# Patient Record
Sex: Female | Born: 1945
Health system: Southern US, Community
[De-identification: ages and names within clinical notes are randomized; demographics above are authoritative.]

## PROBLEM LIST (undated history)

## (undated) DIAGNOSIS — K760 Fatty (change of) liver, not elsewhere classified: Secondary | ICD-10-CM

## (undated) DIAGNOSIS — M8580 Other specified disorders of bone density and structure, unspecified site: Secondary | ICD-10-CM

## (undated) DIAGNOSIS — G4700 Insomnia, unspecified: Secondary | ICD-10-CM

## (undated) DIAGNOSIS — R7303 Prediabetes: Secondary | ICD-10-CM

## (undated) DIAGNOSIS — M1990 Unspecified osteoarthritis, unspecified site: Secondary | ICD-10-CM

## (undated) DIAGNOSIS — E785 Hyperlipidemia, unspecified: Secondary | ICD-10-CM

## (undated) DIAGNOSIS — I10 Essential (primary) hypertension: Secondary | ICD-10-CM

## (undated) DIAGNOSIS — E039 Hypothyroidism, unspecified: Secondary | ICD-10-CM

## (undated) DIAGNOSIS — K219 Gastro-esophageal reflux disease without esophagitis: Secondary | ICD-10-CM

---

## 1998-04-11 ENCOUNTER — Other Ambulatory Visit: Admission: RE | Admit: 1998-04-11 | Discharge: 1998-04-11 | Payer: Self-pay | Admitting: Obstetrics and Gynecology

## 1999-04-16 ENCOUNTER — Other Ambulatory Visit: Admission: RE | Admit: 1999-04-16 | Discharge: 1999-04-16 | Payer: Self-pay | Admitting: Obstetrics and Gynecology

## 1999-04-24 ENCOUNTER — Encounter: Admission: RE | Admit: 1999-04-24 | Discharge: 1999-04-24 | Payer: Self-pay | Admitting: Obstetrics and Gynecology

## 1999-04-24 ENCOUNTER — Encounter: Payer: Self-pay | Admitting: Obstetrics and Gynecology

## 1999-05-15 ENCOUNTER — Encounter (INDEPENDENT_AMBULATORY_CARE_PROVIDER_SITE_OTHER): Payer: Self-pay

## 1999-05-15 ENCOUNTER — Other Ambulatory Visit: Admission: RE | Admit: 1999-05-15 | Discharge: 1999-05-15 | Payer: Self-pay | Admitting: Obstetrics and Gynecology

## 2000-04-24 ENCOUNTER — Encounter: Payer: Self-pay | Admitting: Obstetrics and Gynecology

## 2000-04-24 ENCOUNTER — Encounter: Admission: RE | Admit: 2000-04-24 | Discharge: 2000-04-24 | Payer: Self-pay | Admitting: Obstetrics and Gynecology

## 2000-04-27 ENCOUNTER — Other Ambulatory Visit: Admission: RE | Admit: 2000-04-27 | Discharge: 2000-04-27 | Payer: Self-pay | Admitting: Obstetrics and Gynecology

## 2000-05-22 ENCOUNTER — Other Ambulatory Visit: Admission: RE | Admit: 2000-05-22 | Discharge: 2000-05-22 | Payer: Self-pay | Admitting: Obstetrics and Gynecology

## 2000-05-22 ENCOUNTER — Encounter (INDEPENDENT_AMBULATORY_CARE_PROVIDER_SITE_OTHER): Payer: Self-pay

## 2001-04-26 ENCOUNTER — Encounter: Admission: RE | Admit: 2001-04-26 | Discharge: 2001-04-26 | Payer: Self-pay | Admitting: Obstetrics and Gynecology

## 2001-04-26 ENCOUNTER — Encounter: Payer: Self-pay | Admitting: Obstetrics and Gynecology

## 2001-04-29 ENCOUNTER — Other Ambulatory Visit: Admission: RE | Admit: 2001-04-29 | Discharge: 2001-04-29 | Payer: Self-pay | Admitting: Obstetrics and Gynecology

## 2002-04-27 ENCOUNTER — Encounter: Admission: RE | Admit: 2002-04-27 | Discharge: 2002-04-27 | Payer: Self-pay | Admitting: Obstetrics and Gynecology

## 2002-04-27 ENCOUNTER — Encounter: Payer: Self-pay | Admitting: Obstetrics and Gynecology

## 2003-06-23 ENCOUNTER — Encounter: Admission: RE | Admit: 2003-06-23 | Discharge: 2003-06-23 | Payer: Self-pay | Admitting: Obstetrics and Gynecology

## 2003-08-08 ENCOUNTER — Encounter: Admission: RE | Admit: 2003-08-08 | Discharge: 2003-08-08 | Payer: Self-pay | Admitting: Family Medicine

## 2004-06-24 ENCOUNTER — Encounter: Admission: RE | Admit: 2004-06-24 | Discharge: 2004-06-24 | Payer: Self-pay | Admitting: Obstetrics and Gynecology

## 2005-06-23 ENCOUNTER — Other Ambulatory Visit: Admission: RE | Admit: 2005-06-23 | Discharge: 2005-06-23 | Payer: Self-pay | Admitting: Obstetrics and Gynecology

## 2005-06-24 ENCOUNTER — Encounter: Admission: RE | Admit: 2005-06-24 | Discharge: 2005-06-24 | Payer: Self-pay | Admitting: Obstetrics and Gynecology

## 2006-06-25 ENCOUNTER — Encounter: Admission: RE | Admit: 2006-06-25 | Discharge: 2006-06-25 | Payer: Self-pay | Admitting: Obstetrics and Gynecology

## 2007-07-07 ENCOUNTER — Encounter: Admission: RE | Admit: 2007-07-07 | Discharge: 2007-07-07 | Payer: Self-pay | Admitting: Family Medicine

## 2008-03-14 ENCOUNTER — Encounter: Admission: RE | Admit: 2008-03-14 | Discharge: 2008-03-14 | Payer: Self-pay | Admitting: Family Medicine

## 2008-03-17 ENCOUNTER — Encounter: Admission: RE | Admit: 2008-03-17 | Discharge: 2008-03-17 | Payer: Self-pay | Admitting: Family Medicine

## 2008-05-05 ENCOUNTER — Encounter: Admission: RE | Admit: 2008-05-05 | Discharge: 2008-05-05 | Payer: Self-pay | Admitting: Family Medicine

## 2008-07-31 ENCOUNTER — Encounter: Admission: RE | Admit: 2008-07-31 | Discharge: 2008-07-31 | Payer: Self-pay | Admitting: Family Medicine

## 2009-08-02 ENCOUNTER — Encounter: Admission: RE | Admit: 2009-08-02 | Discharge: 2009-08-02 | Payer: Self-pay | Admitting: Family Medicine

## 2010-07-31 ENCOUNTER — Other Ambulatory Visit: Payer: Self-pay | Admitting: Family Medicine

## 2010-07-31 DIAGNOSIS — Z1231 Encounter for screening mammogram for malignant neoplasm of breast: Secondary | ICD-10-CM

## 2010-08-06 ENCOUNTER — Ambulatory Visit
Admission: RE | Admit: 2010-08-06 | Discharge: 2010-08-06 | Disposition: A | Discharge status: Home / Self Care | Payer: BLUE CROSS/BLUE SHIELD | Source: Ambulatory Visit | Attending: Family Medicine | Admitting: Family Medicine

## 2010-08-06 DIAGNOSIS — Z1231 Encounter for screening mammogram for malignant neoplasm of breast: Secondary | ICD-10-CM

## 2011-07-16 ENCOUNTER — Other Ambulatory Visit: Payer: Self-pay | Admitting: Family Medicine

## 2011-07-16 DIAGNOSIS — Z1231 Encounter for screening mammogram for malignant neoplasm of breast: Secondary | ICD-10-CM

## 2011-08-11 ENCOUNTER — Ambulatory Visit
Admission: RE | Admit: 2011-08-11 | Discharge: 2011-08-11 | Disposition: A | Discharge status: Home / Self Care | Payer: BC Managed Care – PPO | Source: Ambulatory Visit | Attending: Family Medicine | Admitting: Family Medicine

## 2011-08-11 DIAGNOSIS — Z1231 Encounter for screening mammogram for malignant neoplasm of breast: Secondary | ICD-10-CM

## 2011-09-03 ENCOUNTER — Ambulatory Visit (HOSPITAL_COMMUNITY)
Admission: RE | Admit: 2011-09-03 | Discharge: 2011-09-03 | Disposition: A | Discharge status: Home / Self Care | Payer: Worker's Compensation | Source: Ambulatory Visit | Attending: Family Medicine | Admitting: Family Medicine

## 2011-09-03 DIAGNOSIS — S8010XA Contusion of unspecified lower leg, initial encounter: Secondary | ICD-10-CM | POA: Insufficient documentation

## 2011-09-03 DIAGNOSIS — M79609 Pain in unspecified limb: Secondary | ICD-10-CM

## 2011-09-03 DIAGNOSIS — R52 Pain, unspecified: Secondary | ICD-10-CM

## 2011-09-03 DIAGNOSIS — X58XXXA Exposure to other specified factors, initial encounter: Secondary | ICD-10-CM | POA: Insufficient documentation

## 2011-09-03 NOTE — Progress Notes (Addendum)
Left lower extremity venous duplex completed.  Preliminary report is negative for DVT, SVT, or a Baker's cyst in the left leg.  Negative for DVT in the right common femoral vein.  Left message on the nurse's line.  Asked patient to return to office to schedule appointment for tomorrow per the instructions left at time this appointment was made.  The patient stated that Dr. Aurelio Brash office closed at 5.  She has an appointment to return at 9 tomorrow morning.

## 2012-09-03 ENCOUNTER — Other Ambulatory Visit: Payer: Self-pay

## 2012-09-03 DIAGNOSIS — Z1231 Encounter for screening mammogram for malignant neoplasm of breast: Secondary | ICD-10-CM

## 2012-09-30 ENCOUNTER — Ambulatory Visit
Admission: RE | Admit: 2012-09-30 | Discharge: 2012-09-30 | Disposition: A | Discharge status: Home / Self Care | Payer: BC Managed Care – PPO | Source: Ambulatory Visit

## 2012-09-30 DIAGNOSIS — Z1231 Encounter for screening mammogram for malignant neoplasm of breast: Secondary | ICD-10-CM

## 2013-06-13 ENCOUNTER — Other Ambulatory Visit: Payer: Self-pay | Admitting: Family

## 2013-06-13 DIAGNOSIS — Z1231 Encounter for screening mammogram for malignant neoplasm of breast: Secondary | ICD-10-CM

## 2013-06-13 DIAGNOSIS — Z780 Asymptomatic menopausal state: Secondary | ICD-10-CM

## 2013-10-03 ENCOUNTER — Ambulatory Visit
Admission: RE | Admit: 2013-10-03 | Discharge: 2013-10-03 | Disposition: A | Discharge status: Home / Self Care | Payer: BC Managed Care – PPO | Source: Ambulatory Visit | Attending: Family | Admitting: Family

## 2013-10-03 DIAGNOSIS — Z780 Asymptomatic menopausal state: Secondary | ICD-10-CM

## 2013-10-03 DIAGNOSIS — Z1231 Encounter for screening mammogram for malignant neoplasm of breast: Secondary | ICD-10-CM

## 2014-01-18 ENCOUNTER — Ambulatory Visit
Admission: RE | Admit: 2014-01-18 | Discharge: 2014-01-18 | Disposition: A | Discharge status: Home / Self Care | Payer: BC Managed Care – PPO | Source: Ambulatory Visit | Attending: Family Medicine | Admitting: Family Medicine

## 2014-01-18 ENCOUNTER — Other Ambulatory Visit: Payer: Self-pay | Admitting: Family Medicine

## 2014-01-18 DIAGNOSIS — M25562 Pain in left knee: Secondary | ICD-10-CM

## 2014-01-18 DIAGNOSIS — M25561 Pain in right knee: Secondary | ICD-10-CM

## 2014-09-06 ENCOUNTER — Other Ambulatory Visit: Payer: Self-pay

## 2014-09-06 DIAGNOSIS — Z1231 Encounter for screening mammogram for malignant neoplasm of breast: Secondary | ICD-10-CM

## 2014-10-09 ENCOUNTER — Ambulatory Visit
Admission: RE | Admit: 2014-10-09 | Discharge: 2014-10-09 | Disposition: A | Discharge status: Home / Self Care | Payer: PPO | Source: Ambulatory Visit

## 2014-10-09 DIAGNOSIS — Z1231 Encounter for screening mammogram for malignant neoplasm of breast: Secondary | ICD-10-CM

## 2015-06-07 DIAGNOSIS — M179 Osteoarthritis of knee, unspecified: Secondary | ICD-10-CM | POA: Diagnosis not present

## 2015-06-07 DIAGNOSIS — E559 Vitamin D deficiency, unspecified: Secondary | ICD-10-CM | POA: Diagnosis not present

## 2015-06-07 DIAGNOSIS — K219 Gastro-esophageal reflux disease without esophagitis: Secondary | ICD-10-CM | POA: Diagnosis not present

## 2015-06-07 DIAGNOSIS — I10 Essential (primary) hypertension: Secondary | ICD-10-CM | POA: Diagnosis not present

## 2015-06-07 DIAGNOSIS — E039 Hypothyroidism, unspecified: Secondary | ICD-10-CM | POA: Diagnosis not present

## 2015-06-07 DIAGNOSIS — E782 Mixed hyperlipidemia: Secondary | ICD-10-CM | POA: Diagnosis not present

## 2015-06-20 DIAGNOSIS — M5441 Lumbago with sciatica, right side: Secondary | ICD-10-CM | POA: Diagnosis not present

## 2015-06-25 DIAGNOSIS — T7840XA Allergy, unspecified, initial encounter: Secondary | ICD-10-CM | POA: Diagnosis not present

## 2015-06-25 DIAGNOSIS — M545 Low back pain: Secondary | ICD-10-CM | POA: Diagnosis not present

## 2015-06-26 DIAGNOSIS — N760 Acute vaginitis: Secondary | ICD-10-CM | POA: Diagnosis not present

## 2015-07-02 DIAGNOSIS — M4806 Spinal stenosis, lumbar region: Secondary | ICD-10-CM | POA: Diagnosis not present

## 2015-07-19 DIAGNOSIS — M4806 Spinal stenosis, lumbar region: Secondary | ICD-10-CM | POA: Diagnosis not present

## 2015-07-24 DIAGNOSIS — N760 Acute vaginitis: Secondary | ICD-10-CM | POA: Diagnosis not present

## 2015-08-15 DIAGNOSIS — M4806 Spinal stenosis, lumbar region: Secondary | ICD-10-CM | POA: Diagnosis not present

## 2015-09-21 DIAGNOSIS — M4806 Spinal stenosis, lumbar region: Secondary | ICD-10-CM | POA: Diagnosis not present

## 2015-10-12 ENCOUNTER — Other Ambulatory Visit: Payer: Self-pay | Admitting: Family Medicine

## 2015-10-12 DIAGNOSIS — Z1231 Encounter for screening mammogram for malignant neoplasm of breast: Secondary | ICD-10-CM

## 2015-11-06 DIAGNOSIS — M4806 Spinal stenosis, lumbar region: Secondary | ICD-10-CM | POA: Diagnosis not present

## 2015-11-14 ENCOUNTER — Ambulatory Visit: Payer: Self-pay

## 2015-12-05 ENCOUNTER — Ambulatory Visit
Admission: RE | Admit: 2015-12-05 | Discharge: 2015-12-05 | Disposition: A | Discharge status: Home / Self Care | Payer: PPO | Source: Ambulatory Visit | Attending: Family Medicine | Admitting: Family Medicine

## 2015-12-05 DIAGNOSIS — M179 Osteoarthritis of knee, unspecified: Secondary | ICD-10-CM | POA: Diagnosis not present

## 2015-12-05 DIAGNOSIS — I10 Essential (primary) hypertension: Secondary | ICD-10-CM | POA: Diagnosis not present

## 2015-12-05 DIAGNOSIS — Z1231 Encounter for screening mammogram for malignant neoplasm of breast: Secondary | ICD-10-CM | POA: Diagnosis not present

## 2015-12-05 DIAGNOSIS — E039 Hypothyroidism, unspecified: Secondary | ICD-10-CM | POA: Diagnosis not present

## 2015-12-05 DIAGNOSIS — E782 Mixed hyperlipidemia: Secondary | ICD-10-CM | POA: Diagnosis not present

## 2015-12-05 DIAGNOSIS — K219 Gastro-esophageal reflux disease without esophagitis: Secondary | ICD-10-CM | POA: Diagnosis not present

## 2015-12-05 DIAGNOSIS — E559 Vitamin D deficiency, unspecified: Secondary | ICD-10-CM | POA: Diagnosis not present

## 2015-12-05 DIAGNOSIS — E669 Obesity, unspecified: Secondary | ICD-10-CM | POA: Diagnosis not present

## 2016-01-03 DIAGNOSIS — H40013 Open angle with borderline findings, low risk, bilateral: Secondary | ICD-10-CM | POA: Diagnosis not present

## 2016-01-03 DIAGNOSIS — H04123 Dry eye syndrome of bilateral lacrimal glands: Secondary | ICD-10-CM | POA: Diagnosis not present

## 2016-01-03 DIAGNOSIS — H10413 Chronic giant papillary conjunctivitis, bilateral: Secondary | ICD-10-CM | POA: Diagnosis not present

## 2016-01-03 DIAGNOSIS — H35373 Puckering of macula, bilateral: Secondary | ICD-10-CM | POA: Diagnosis not present

## 2016-01-03 DIAGNOSIS — H2513 Age-related nuclear cataract, bilateral: Secondary | ICD-10-CM | POA: Diagnosis not present

## 2016-01-16 DIAGNOSIS — L03113 Cellulitis of right upper limb: Secondary | ICD-10-CM | POA: Diagnosis not present

## 2016-01-18 DIAGNOSIS — L309 Dermatitis, unspecified: Secondary | ICD-10-CM | POA: Diagnosis not present

## 2016-01-30 DIAGNOSIS — Z01419 Encounter for gynecological examination (general) (routine) without abnormal findings: Secondary | ICD-10-CM | POA: Diagnosis not present

## 2016-02-26 DIAGNOSIS — Z1211 Encounter for screening for malignant neoplasm of colon: Secondary | ICD-10-CM | POA: Diagnosis not present

## 2016-03-07 DIAGNOSIS — M542 Cervicalgia: Secondary | ICD-10-CM | POA: Diagnosis not present

## 2016-05-29 ENCOUNTER — Other Ambulatory Visit: Payer: Self-pay | Admitting: Family Medicine

## 2016-05-29 ENCOUNTER — Ambulatory Visit
Admission: RE | Admit: 2016-05-29 | Discharge: 2016-05-29 | Disposition: A | Discharge status: Home / Self Care | Payer: PPO | Source: Ambulatory Visit | Attending: Family Medicine | Admitting: Family Medicine

## 2016-05-29 DIAGNOSIS — M542 Cervicalgia: Secondary | ICD-10-CM

## 2016-05-29 DIAGNOSIS — M179 Osteoarthritis of knee, unspecified: Secondary | ICD-10-CM | POA: Diagnosis not present

## 2016-05-29 DIAGNOSIS — I10 Essential (primary) hypertension: Secondary | ICD-10-CM | POA: Diagnosis not present

## 2016-05-29 DIAGNOSIS — K529 Noninfective gastroenteritis and colitis, unspecified: Secondary | ICD-10-CM | POA: Diagnosis not present

## 2016-05-29 DIAGNOSIS — E669 Obesity, unspecified: Secondary | ICD-10-CM | POA: Diagnosis not present

## 2016-05-29 DIAGNOSIS — Z6831 Body mass index (BMI) 31.0-31.9, adult: Secondary | ICD-10-CM | POA: Diagnosis not present

## 2016-05-29 DIAGNOSIS — E559 Vitamin D deficiency, unspecified: Secondary | ICD-10-CM | POA: Diagnosis not present

## 2016-05-29 DIAGNOSIS — K219 Gastro-esophageal reflux disease without esophagitis: Secondary | ICD-10-CM | POA: Diagnosis not present

## 2016-05-29 DIAGNOSIS — E039 Hypothyroidism, unspecified: Secondary | ICD-10-CM | POA: Diagnosis not present

## 2016-09-05 DIAGNOSIS — R1013 Epigastric pain: Secondary | ICD-10-CM | POA: Diagnosis not present

## 2016-10-27 ENCOUNTER — Other Ambulatory Visit: Payer: Self-pay

## 2016-10-27 DIAGNOSIS — Z1231 Encounter for screening mammogram for malignant neoplasm of breast: Secondary | ICD-10-CM

## 2016-12-08 ENCOUNTER — Ambulatory Visit
Admission: RE | Admit: 2016-12-08 | Discharge: 2016-12-08 | Disposition: A | Discharge status: Home / Self Care | Payer: PPO | Source: Ambulatory Visit | Attending: Family Medicine | Admitting: Family Medicine

## 2016-12-08 ENCOUNTER — Ambulatory Visit: Payer: Self-pay

## 2016-12-08 DIAGNOSIS — Z1231 Encounter for screening mammogram for malignant neoplasm of breast: Secondary | ICD-10-CM | POA: Diagnosis not present

## 2016-12-19 DIAGNOSIS — E669 Obesity, unspecified: Secondary | ICD-10-CM | POA: Diagnosis not present

## 2016-12-19 DIAGNOSIS — M179 Osteoarthritis of knee, unspecified: Secondary | ICD-10-CM | POA: Diagnosis not present

## 2016-12-19 DIAGNOSIS — E039 Hypothyroidism, unspecified: Secondary | ICD-10-CM | POA: Diagnosis not present

## 2016-12-19 DIAGNOSIS — E782 Mixed hyperlipidemia: Secondary | ICD-10-CM | POA: Diagnosis not present

## 2016-12-19 DIAGNOSIS — K219 Gastro-esophageal reflux disease without esophagitis: Secondary | ICD-10-CM | POA: Diagnosis not present

## 2016-12-19 DIAGNOSIS — I10 Essential (primary) hypertension: Secondary | ICD-10-CM | POA: Diagnosis not present

## 2016-12-19 DIAGNOSIS — E559 Vitamin D deficiency, unspecified: Secondary | ICD-10-CM | POA: Diagnosis not present

## 2017-04-15 DIAGNOSIS — H35373 Puckering of macula, bilateral: Secondary | ICD-10-CM | POA: Diagnosis not present

## 2017-04-15 DIAGNOSIS — H2513 Age-related nuclear cataract, bilateral: Secondary | ICD-10-CM | POA: Diagnosis not present

## 2017-04-15 DIAGNOSIS — H04123 Dry eye syndrome of bilateral lacrimal glands: Secondary | ICD-10-CM | POA: Diagnosis not present

## 2017-04-15 DIAGNOSIS — H10413 Chronic giant papillary conjunctivitis, bilateral: Secondary | ICD-10-CM | POA: Diagnosis not present

## 2017-04-15 DIAGNOSIS — H40013 Open angle with borderline findings, low risk, bilateral: Secondary | ICD-10-CM | POA: Diagnosis not present

## 2017-05-01 DIAGNOSIS — Z6829 Body mass index (BMI) 29.0-29.9, adult: Secondary | ICD-10-CM | POA: Diagnosis not present

## 2017-05-01 DIAGNOSIS — Z124 Encounter for screening for malignant neoplasm of cervix: Secondary | ICD-10-CM | POA: Diagnosis not present

## 2017-05-01 DIAGNOSIS — Z01419 Encounter for gynecological examination (general) (routine) without abnormal findings: Secondary | ICD-10-CM | POA: Diagnosis not present

## 2017-06-22 DIAGNOSIS — I10 Essential (primary) hypertension: Secondary | ICD-10-CM | POA: Diagnosis not present

## 2017-06-22 DIAGNOSIS — E559 Vitamin D deficiency, unspecified: Secondary | ICD-10-CM | POA: Diagnosis not present

## 2017-06-22 DIAGNOSIS — E039 Hypothyroidism, unspecified: Secondary | ICD-10-CM | POA: Diagnosis not present

## 2017-06-22 DIAGNOSIS — E782 Mixed hyperlipidemia: Secondary | ICD-10-CM | POA: Diagnosis not present

## 2017-06-22 DIAGNOSIS — E669 Obesity, unspecified: Secondary | ICD-10-CM | POA: Diagnosis not present

## 2017-06-22 DIAGNOSIS — M179 Osteoarthritis of knee, unspecified: Secondary | ICD-10-CM | POA: Diagnosis not present

## 2017-06-22 DIAGNOSIS — K219 Gastro-esophageal reflux disease without esophagitis: Secondary | ICD-10-CM | POA: Diagnosis not present

## 2017-07-22 DIAGNOSIS — R748 Abnormal levels of other serum enzymes: Secondary | ICD-10-CM | POA: Diagnosis not present

## 2017-11-05 ENCOUNTER — Other Ambulatory Visit: Payer: Self-pay | Admitting: Family Medicine

## 2017-11-05 DIAGNOSIS — Z1231 Encounter for screening mammogram for malignant neoplasm of breast: Secondary | ICD-10-CM

## 2017-12-09 ENCOUNTER — Ambulatory Visit
Admission: RE | Admit: 2017-12-09 | Discharge: 2017-12-09 | Disposition: A | Discharge status: Home / Self Care | Payer: PPO | Source: Ambulatory Visit | Attending: Family Medicine | Admitting: Family Medicine

## 2017-12-09 DIAGNOSIS — Z1231 Encounter for screening mammogram for malignant neoplasm of breast: Secondary | ICD-10-CM | POA: Diagnosis not present

## 2017-12-18 DIAGNOSIS — L259 Unspecified contact dermatitis, unspecified cause: Secondary | ICD-10-CM | POA: Diagnosis not present

## 2017-12-30 DIAGNOSIS — E669 Obesity, unspecified: Secondary | ICD-10-CM | POA: Diagnosis not present

## 2017-12-30 DIAGNOSIS — E782 Mixed hyperlipidemia: Secondary | ICD-10-CM | POA: Diagnosis not present

## 2017-12-30 DIAGNOSIS — E039 Hypothyroidism, unspecified: Secondary | ICD-10-CM | POA: Diagnosis not present

## 2017-12-30 DIAGNOSIS — E559 Vitamin D deficiency, unspecified: Secondary | ICD-10-CM | POA: Diagnosis not present

## 2017-12-30 DIAGNOSIS — L259 Unspecified contact dermatitis, unspecified cause: Secondary | ICD-10-CM | POA: Diagnosis not present

## 2017-12-30 DIAGNOSIS — I10 Essential (primary) hypertension: Secondary | ICD-10-CM | POA: Diagnosis not present

## 2017-12-30 DIAGNOSIS — M179 Osteoarthritis of knee, unspecified: Secondary | ICD-10-CM | POA: Diagnosis not present

## 2017-12-30 DIAGNOSIS — R200 Anesthesia of skin: Secondary | ICD-10-CM | POA: Diagnosis not present

## 2017-12-30 DIAGNOSIS — K219 Gastro-esophageal reflux disease without esophagitis: Secondary | ICD-10-CM | POA: Diagnosis not present

## 2017-12-30 DIAGNOSIS — Z23 Encounter for immunization: Secondary | ICD-10-CM | POA: Diagnosis not present

## 2018-04-29 DIAGNOSIS — H35373 Puckering of macula, bilateral: Secondary | ICD-10-CM | POA: Diagnosis not present

## 2018-04-29 DIAGNOSIS — H10413 Chronic giant papillary conjunctivitis, bilateral: Secondary | ICD-10-CM | POA: Diagnosis not present

## 2018-04-29 DIAGNOSIS — H04123 Dry eye syndrome of bilateral lacrimal glands: Secondary | ICD-10-CM | POA: Diagnosis not present

## 2018-04-29 DIAGNOSIS — Q100 Congenital ptosis: Secondary | ICD-10-CM | POA: Diagnosis not present

## 2018-04-29 DIAGNOSIS — H40013 Open angle with borderline findings, low risk, bilateral: Secondary | ICD-10-CM | POA: Diagnosis not present

## 2018-04-29 DIAGNOSIS — H2513 Age-related nuclear cataract, bilateral: Secondary | ICD-10-CM | POA: Diagnosis not present

## 2018-05-26 DIAGNOSIS — Z01419 Encounter for gynecological examination (general) (routine) without abnormal findings: Secondary | ICD-10-CM | POA: Diagnosis not present

## 2018-06-14 NOTE — Progress Notes (Signed)
[facility] [address] [place], [place] [address] Phone: [phone] Subjective:   I Taylor Richards am serving as a Neurosurgeon for Dr. Antoine Primas.   CC: Right elbow pain follow-up  GTX:[id number]  Taylor Richards is a 73 y.o. female coming in with complaint of right elbow pain. Sometimes has numbness and tingling in fingertips.   Onset- Chronic Location- medial epi Character- sharp  Aggravating factors- ADLs, picking things up  Severity-5/10     No past medical history on file. No past surgical history on file. Social History   Socioeconomic History  . Marital status: Single    Spouse name: Not on file  . Number of children: Not on file  . Years of education: Not on file  . Highest education level: Not on file  Occupational History  . Not on file  Social Needs  . Financial resource strain: Not on file  . Food insecurity:    Worry: Not on file    Inability: Not on file  . Transportation needs:    Medical: Not on file    Non-medical: Not on file  Tobacco Use  . Smoking status: Not on file  Substance and Sexual Activity  . Alcohol use: Not on file  . Drug use: Not on file  . Sexual activity: Not on file  Lifestyle  . Physical activity:    Days per week: Not on file    Minutes per session: Not on file  . Stress: Not on file  Relationships  . Social connections:    Talks on phone: Not on file    Gets together: Not on file    Attends religious service: Not on file    Active member of club or organization: Not on file    Attends meetings of clubs or organizations: Not on file    Relationship status: Not on file  Other Topics Concern  . Not on file  Social History Narrative  . Not on file   Allergies not on file No family history on file.  No family history of autoimmune No current outpatient medications on file.    Past medical history, social, surgical and family history all reviewed in electronic medical record.  No  pertanent information unless stated regarding to the chief complaint.   Review of Systems:  No headache, visual changes, nausea, vomiting, diarrhea, constipation, dizziness, abdominal pain, skin rash, fevers, chills, night sweats, weight loss, swollen lymph nodes, body aches, joint swelling, muscle aches, chest pain, shortness of breath, mood changes.   Objective  Blood pressure 110/72, pulse 69, height 5\' 3"  (1.6 m), weight 178 lb (80.7 kg), SpO2 96 %.   General: No apparent distress alert and oriented x3 mood and affect normal, dressed appropriately.  HEENT: Pupils equal, extraocular movements intact  Respiratory: Patient's speak in full sentences and does not appear short of breath  Cardiovascular: No lower extremity edema, non tender, no erythema  Skin: Warm dry intact with no signs of infection or rash on extremities or on axial skeleton.  Abdomen: Soft nontender  Neuro: Cranial nerves II through XII are intact, neurovascularly intact in all extremities with 2+ DTRs and 2+ pulses.  Lymph: No lymphadenopathy of posterior or anterior cervical chain or axillae bilaterally.  Gait normal with good balance and coordination.  MSK:  Non tender with full range of motion and good stability and symmetric strength and tone of shoulders,  wrist, hip, knee and ankles bilaterally.  Elbow: Right Unremarkable  to inspection. Range of motion full pronation, supination, flexion, extension. Strength is full to all of the above directions Stable to varus, valgus stress. Negative moving valgus stress test. Tender over the medial aspect of the elbow. Ulnar nerve does not sublux. Positive cubital tunnel Tinel's. Collateral elbow unremarkable  Musculoskeletal ultrasound was performed and interpreted by Terrilee Files D.O.   Elbow:  Lateral epicondyle and common extensor tendon origin visualized.  No edema, effusions, or avulsions seen.  Radial head unremarkable and located in annular ligament Medial  epicondyle common flexor tendon does show some mild chronic scarring noted.  Ulnar nerve in the cubital tunnel does have some scarring noted surrounding the area but no true dilation.  IMPRESSION: Medial epicondylitis with possible small impingement of the cubital fossa   Impression and Recommendations:     This case required medical decision making of moderate complexity. The above documentation has been reviewed and is accurate and complete Judi Saa, DO       Note: This dictation was prepared with Dragon dictation along with smaller phrase technology. Any transcriptional errors that result from this process are unintentional.

## 2018-06-15 ENCOUNTER — Ambulatory Visit: Payer: PPO | Admitting: Family Medicine

## 2018-06-15 ENCOUNTER — Ambulatory Visit: Payer: Self-pay

## 2018-06-15 VITALS — BP 110/72 | HR 69 | Ht 63.0 in | Wt 178.0 lb

## 2018-06-15 DIAGNOSIS — M25521 Pain in right elbow: Secondary | ICD-10-CM

## 2018-06-15 DIAGNOSIS — M7701 Medial epicondylitis, right elbow: Secondary | ICD-10-CM | POA: Diagnosis not present

## 2018-06-15 NOTE — Patient Instructions (Signed)
Good to see you  Gabapentin 200mg  at night to help the nerve pain  Ice 20 minutes 2 times daily. Usually after activity and before bed. Exercises 3 times a week.  Elbow compression sleeve can help a lot- look at [facility] or a sporting supply store  pennsaid pinkie amount topically 2 times daily as needed.  No heavy lifting See me again in 4 weeks

## 2018-06-15 NOTE — Assessment & Plan Note (Addendum)
Right medial epicondylitis.  Discussed icing regimen and home exercise.  We discussed compression as well.  Discussed which activities to do which also avoid.  Gabapentin 200mg  at night   follow-up again in 4 to 8 weeks

## 2018-07-02 DIAGNOSIS — Z1159 Encounter for screening for other viral diseases: Secondary | ICD-10-CM | POA: Diagnosis not present

## 2018-07-02 DIAGNOSIS — E782 Mixed hyperlipidemia: Secondary | ICD-10-CM | POA: Diagnosis not present

## 2018-07-02 DIAGNOSIS — K219 Gastro-esophageal reflux disease without esophagitis: Secondary | ICD-10-CM | POA: Diagnosis not present

## 2018-07-02 DIAGNOSIS — E039 Hypothyroidism, unspecified: Secondary | ICD-10-CM | POA: Diagnosis not present

## 2018-07-02 DIAGNOSIS — Z6831 Body mass index (BMI) 31.0-31.9, adult: Secondary | ICD-10-CM | POA: Diagnosis not present

## 2018-07-02 DIAGNOSIS — E559 Vitamin D deficiency, unspecified: Secondary | ICD-10-CM | POA: Diagnosis not present

## 2018-07-02 DIAGNOSIS — I10 Essential (primary) hypertension: Secondary | ICD-10-CM | POA: Diagnosis not present

## 2018-07-02 DIAGNOSIS — M179 Osteoarthritis of knee, unspecified: Secondary | ICD-10-CM | POA: Diagnosis not present

## 2018-07-02 DIAGNOSIS — E669 Obesity, unspecified: Secondary | ICD-10-CM | POA: Diagnosis not present

## 2018-07-14 ENCOUNTER — Encounter: Payer: Self-pay | Admitting: Family Medicine

## 2018-07-14 ENCOUNTER — Ambulatory Visit: Payer: PPO | Admitting: Family Medicine

## 2018-07-14 ENCOUNTER — Other Ambulatory Visit: Payer: Self-pay

## 2018-07-14 DIAGNOSIS — M7701 Medial epicondylitis, right elbow: Secondary | ICD-10-CM | POA: Diagnosis not present

## 2018-07-14 NOTE — Assessment & Plan Note (Signed)
Patient is feeling approximately 70 to 80% better.  Minimal changes noted on ultrasound today.  Discussed icing regimen and home exercise.  Follow-up again in 2 months if pain is not completely resolved.

## 2018-07-14 NOTE — Patient Instructions (Signed)
God to see you  Ice is your friend pennsaid pinkie amount topically 2 times daily as needed.  Try the arm compression  See me again in 2 months

## 2018-07-14 NOTE — Progress Notes (Signed)
[facility] [address] [place], [place] [address] Phone: [phone] Subjective:   I Taylor Richards am serving as a Neurosurgeon for Dr. Antoine Primas.  I'm seeing this patient by the request  of:    CC: Elbow pain follow-up  JDB:[id number]   06/15/2018 Right medial epicondylitis.  Discussed icing regimen and home exercise.  We discussed compression as well.  Discussed which activities to do which also avoid.  Gabapentin 200mg  at night   follow-up again in 4 to 8 weeks  Updated 07/14/2018 Taylor Richards is a 73 y.o. female coming in with complaint of right elbow pain. States the elbow is about the same. Doing a little better.  Patient has been doing the exercises occasionally.  Discussed with patient in great length about this.  Patient is instantly significantly feeling better.  80% better.  Maybe less tenderness to palpation as well.     History reviewed. No pertinent past medical history. History reviewed. No pertinent surgical history. Social History   Socioeconomic History  . Marital status: Single    Spouse name: Not on file  . Number of children: Not on file  . Years of education: Not on file  . Highest education level: Not on file  Occupational History  . Not on file  Social Needs  . Financial resource strain: Not on file  . Food insecurity:    Worry: Not on file    Inability: Not on file  . Transportation needs:    Medical: Not on file    Non-medical: Not on file  Tobacco Use  . Smoking status: Not on file  Substance and Sexual Activity  . Alcohol use: Not on file  . Drug use: Not on file  . Sexual activity: Not on file  Lifestyle  . Physical activity:    Days per week: Not on file    Minutes per session: Not on file  . Stress: Not on file  Relationships  . Social connections:    Talks on phone: Not on file    Gets together: Not on file    Attends religious service: Not on file    Active member of club or organization: Not on  file    Attends meetings of clubs or organizations: Not on file    Relationship status: Not on file  Other Topics Concern  . Not on file  Social History Narrative  . Not on file   Not on File History reviewed. No pertinent family history. No current outpatient medications on file.    Past medical history, social, surgical and family history all reviewed in electronic medical record.  No pertanent information unless stated regarding to the chief complaint.   Review of Systems:  No headache, visual changes, nausea, vomiting, diarrhea, constipation, dizziness, abdominal pain, skin rash, fevers, chills, night sweats, weight loss, swollen lymph nodes, body aches, joint swelling, muscle aches, chest pain, shortness of breath, mood changes.   Objective  Blood pressure 122/70, pulse 73, height 5\' 3"  (1.6 m), weight 176 lb (79.8 kg), SpO2 97 %.   General: No apparent distress alert and oriented x3 mood and affect normal, dressed appropriately.  HEENT: Pupils equal, extraocular movements intact  Respiratory: Patient's speak in full sentences and does not appear short of breath  Cardiovascular: No lower extremity edema, non tender, no erythema  Skin: Warm dry intact with no signs of infection or rash on extremities or on axial skeleton.  Abdomen: Soft nontender  Neuro:  Cranial nerves II through XII are intact, neurovascularly intact in all extremities with 2+ DTRs and 2+ pulses.  Lymph: No lymphadenopathy of posterior or anterior cervical chain or axillae bilaterally.  Gait antalgic favoring the right knee MSK:  Non tender with full range of motion and good stability and symmetric strength and tone of shoulders,  wrist, hip, and ankles bilaterally.  Varus deformity of the right knee Right elbow exam shows very mild tenderness over the medial epicondylar region.  Patient has full range of motion.  Full strength.  Neurovascular intact distally.  Negative Tinel's today.     Impression and  Recommendations:     This case required medical decision making of moderate complexity. The above documentation has been reviewed and is accurate and complete Judi Saa, DO       Note: This dictation was prepared with Dragon dictation along with smaller phrase technology. Any transcriptional errors that result from this process are unintentional.

## 2018-08-13 MED FILL — VIT D2 1.25 MG (50,000 UNIT: 1.25 MG | 84 days supply | Qty: 12 | Fill #0

## 2018-09-21 MED FILL — AMLODIPINE BESYLATE 5 MG TA: 5 | 90 days supply | Qty: 90 | Fill #0

## 2018-09-21 MED FILL — LISINOPRIL-HCTZ 20-25 MG TA: 20-25 | 90 days supply | Qty: 90 | Fill #0

## 2018-09-21 MED FILL — PANTOPRAZOLE SOD DR 40 MG T: 40 | 90 days supply | Qty: 90 | Fill #0

## 2018-09-21 MED FILL — POTASSIUM CL ER 10 MEQ TAB: 10 | 90 days supply | Qty: 90 | Fill #0

## 2018-10-18 MED FILL — LEVOTHYROXINE SODIUM 50 MCG: 50 | 90 days supply | Qty: 90 | Fill #0

## 2018-12-30 DIAGNOSIS — N3000 Acute cystitis without hematuria: Secondary | ICD-10-CM | POA: Diagnosis not present

## 2019-01-05 ENCOUNTER — Other Ambulatory Visit: Payer: Self-pay | Admitting: Family Medicine

## 2019-01-05 DIAGNOSIS — M179 Osteoarthritis of knee, unspecified: Secondary | ICD-10-CM | POA: Diagnosis not present

## 2019-01-05 DIAGNOSIS — Z1231 Encounter for screening mammogram for malignant neoplasm of breast: Secondary | ICD-10-CM

## 2019-01-05 DIAGNOSIS — E559 Vitamin D deficiency, unspecified: Secondary | ICD-10-CM | POA: Diagnosis not present

## 2019-01-05 DIAGNOSIS — E039 Hypothyroidism, unspecified: Secondary | ICD-10-CM | POA: Diagnosis not present

## 2019-01-05 DIAGNOSIS — E782 Mixed hyperlipidemia: Secondary | ICD-10-CM | POA: Diagnosis not present

## 2019-01-05 DIAGNOSIS — I10 Essential (primary) hypertension: Secondary | ICD-10-CM | POA: Diagnosis not present

## 2019-01-05 DIAGNOSIS — N3000 Acute cystitis without hematuria: Secondary | ICD-10-CM | POA: Diagnosis not present

## 2019-01-05 DIAGNOSIS — E669 Obesity, unspecified: Secondary | ICD-10-CM | POA: Diagnosis not present

## 2019-01-05 DIAGNOSIS — K219 Gastro-esophageal reflux disease without esophagitis: Secondary | ICD-10-CM | POA: Diagnosis not present

## 2019-01-06 ENCOUNTER — Ambulatory Visit
Admission: RE | Admit: 2019-01-06 | Discharge: 2019-01-06 | Disposition: A | Discharge status: Home / Self Care | Payer: PPO | Source: Ambulatory Visit | Attending: Family Medicine | Admitting: Family Medicine

## 2019-01-06 ENCOUNTER — Other Ambulatory Visit: Payer: Self-pay

## 2019-01-06 DIAGNOSIS — Z1231 Encounter for screening mammogram for malignant neoplasm of breast: Secondary | ICD-10-CM

## 2019-01-11 DIAGNOSIS — I10 Essential (primary) hypertension: Secondary | ICD-10-CM | POA: Diagnosis not present

## 2019-01-11 DIAGNOSIS — Z23 Encounter for immunization: Secondary | ICD-10-CM | POA: Diagnosis not present

## 2019-01-11 DIAGNOSIS — E782 Mixed hyperlipidemia: Secondary | ICD-10-CM | POA: Diagnosis not present

## 2019-02-14 DIAGNOSIS — M48062 Spinal stenosis, lumbar region with neurogenic claudication: Secondary | ICD-10-CM | POA: Diagnosis not present

## 2019-03-03 DIAGNOSIS — M48062 Spinal stenosis, lumbar region with neurogenic claudication: Secondary | ICD-10-CM | POA: Diagnosis not present

## 2019-04-18 DIAGNOSIS — R509 Fever, unspecified: Secondary | ICD-10-CM | POA: Diagnosis not present

## 2019-04-19 DIAGNOSIS — R509 Fever, unspecified: Secondary | ICD-10-CM | POA: Diagnosis not present

## 2019-05-10 DIAGNOSIS — H40013 Open angle with borderline findings, low risk, bilateral: Secondary | ICD-10-CM | POA: Diagnosis not present

## 2019-05-10 DIAGNOSIS — H2513 Age-related nuclear cataract, bilateral: Secondary | ICD-10-CM | POA: Diagnosis not present

## 2019-05-10 DIAGNOSIS — H35373 Puckering of macula, bilateral: Secondary | ICD-10-CM | POA: Diagnosis not present

## 2019-05-10 DIAGNOSIS — H10413 Chronic giant papillary conjunctivitis, bilateral: Secondary | ICD-10-CM | POA: Diagnosis not present

## 2019-05-10 DIAGNOSIS — Q100 Congenital ptosis: Secondary | ICD-10-CM | POA: Diagnosis not present

## 2019-05-10 DIAGNOSIS — H04123 Dry eye syndrome of bilateral lacrimal glands: Secondary | ICD-10-CM | POA: Diagnosis not present

## 2019-06-01 DIAGNOSIS — Z01419 Encounter for gynecological examination (general) (routine) without abnormal findings: Secondary | ICD-10-CM | POA: Diagnosis not present

## 2019-06-12 ENCOUNTER — Ambulatory Visit: Payer: PPO | Attending: Internal Medicine

## 2019-06-12 DIAGNOSIS — Z23 Encounter for immunization: Secondary | ICD-10-CM | POA: Insufficient documentation

## 2019-06-12 NOTE — Progress Notes (Signed)
   Covid-19 Vaccination Clinic  Name:  Taylor Richards    MRN: [id number] DOB: November 19, 1945  06/12/2019  [name] was observed post Covid-19 immunization for 15 minutes without incidence. She was provided with Vaccine Information Sheet and instruction to access the V-Safe system.   [name] was instructed to call 911 with any severe reactions post vaccine: Marland Kitchen Difficulty breathing  . Swelling of your face and throat  . A fast heartbeat  . A bad rash all over your body  . Dizziness and weakness    Immunizations Administered    Name Date Dose VIS Date Route   Pfizer COVID-19 Vaccine 06/12/2019  9:38 AM 0.3 mL 04/08/2019 Intramuscular   Manufacturer: [employer], [employer]   Lot: [id number]   NDC: [address]-4

## 2019-07-05 ENCOUNTER — Ambulatory Visit: Payer: PPO | Attending: Internal Medicine

## 2019-07-05 DIAGNOSIS — E782 Mixed hyperlipidemia: Secondary | ICD-10-CM | POA: Diagnosis not present

## 2019-07-05 DIAGNOSIS — Z23 Encounter for immunization: Secondary | ICD-10-CM | POA: Insufficient documentation

## 2019-07-05 DIAGNOSIS — E669 Obesity, unspecified: Secondary | ICD-10-CM | POA: Diagnosis not present

## 2019-07-05 DIAGNOSIS — K219 Gastro-esophageal reflux disease without esophagitis: Secondary | ICD-10-CM | POA: Diagnosis not present

## 2019-07-05 DIAGNOSIS — E039 Hypothyroidism, unspecified: Secondary | ICD-10-CM | POA: Diagnosis not present

## 2019-07-05 DIAGNOSIS — I10 Essential (primary) hypertension: Secondary | ICD-10-CM | POA: Diagnosis not present

## 2019-07-05 DIAGNOSIS — E559 Vitamin D deficiency, unspecified: Secondary | ICD-10-CM | POA: Diagnosis not present

## 2019-07-05 DIAGNOSIS — M179 Osteoarthritis of knee, unspecified: Secondary | ICD-10-CM | POA: Diagnosis not present

## 2019-07-05 NOTE — Progress Notes (Signed)
   Covid-19 Vaccination Clinic  Name:  Taylor Richards    MRN: [id number] DOB: 05-08-1945  07/05/2019  [name] was observed post Covid-19 immunization for 15 minutes without incident. She was provided with Vaccine Information Sheet and instruction to access the V-Safe system.   [name] was instructed to call 911 with any severe reactions post vaccine: Marland Kitchen Difficulty breathing  . Swelling of face and throat  . A fast heartbeat  . A bad rash all over body  . Dizziness and weakness   Immunizations Administered    Name Date Dose VIS Date Route   Pfizer COVID-19 Vaccine 07/05/2019 10:10 AM 0.3 mL 04/08/2019 Intramuscular   Manufacturer: [employer], [employer]   Lot: [id number]   NDC: [address]-1

## 2019-08-08 DIAGNOSIS — M79604 Pain in right leg: Secondary | ICD-10-CM | POA: Diagnosis not present

## 2019-11-07 DIAGNOSIS — M48062 Spinal stenosis, lumbar region with neurogenic claudication: Secondary | ICD-10-CM | POA: Diagnosis not present

## 2019-11-30 DIAGNOSIS — M48062 Spinal stenosis, lumbar region with neurogenic claudication: Secondary | ICD-10-CM | POA: Diagnosis not present

## 2019-12-23 DIAGNOSIS — M48061 Spinal stenosis, lumbar region without neurogenic claudication: Secondary | ICD-10-CM | POA: Diagnosis not present

## 2019-12-26 ENCOUNTER — Other Ambulatory Visit (HOSPITAL_COMMUNITY): Payer: Self-pay | Admitting: Family Medicine

## 2019-12-28 ENCOUNTER — Other Ambulatory Visit: Payer: Self-pay | Admitting: Family Medicine

## 2019-12-28 DIAGNOSIS — Z1231 Encounter for screening mammogram for malignant neoplasm of breast: Secondary | ICD-10-CM

## 2020-01-10 DIAGNOSIS — E559 Vitamin D deficiency, unspecified: Secondary | ICD-10-CM | POA: Diagnosis not present

## 2020-01-10 DIAGNOSIS — M179 Osteoarthritis of knee, unspecified: Secondary | ICD-10-CM | POA: Diagnosis not present

## 2020-01-10 DIAGNOSIS — Z23 Encounter for immunization: Secondary | ICD-10-CM | POA: Diagnosis not present

## 2020-01-10 DIAGNOSIS — Z0000 Encounter for general adult medical examination without abnormal findings: Secondary | ICD-10-CM | POA: Diagnosis not present

## 2020-01-10 DIAGNOSIS — K219 Gastro-esophageal reflux disease without esophagitis: Secondary | ICD-10-CM | POA: Diagnosis not present

## 2020-01-10 DIAGNOSIS — E039 Hypothyroidism, unspecified: Secondary | ICD-10-CM | POA: Diagnosis not present

## 2020-01-10 DIAGNOSIS — E669 Obesity, unspecified: Secondary | ICD-10-CM | POA: Diagnosis not present

## 2020-01-10 DIAGNOSIS — E782 Mixed hyperlipidemia: Secondary | ICD-10-CM | POA: Diagnosis not present

## 2020-01-10 DIAGNOSIS — I10 Essential (primary) hypertension: Secondary | ICD-10-CM | POA: Diagnosis not present

## 2020-01-18 ENCOUNTER — Other Ambulatory Visit: Payer: Self-pay | Admitting: Family Medicine

## 2020-01-18 ENCOUNTER — Ambulatory Visit
Admission: RE | Admit: 2020-01-18 | Discharge: 2020-01-18 | Disposition: A | Discharge status: Home / Self Care | Payer: PPO | Source: Ambulatory Visit | Attending: Family Medicine | Admitting: Family Medicine

## 2020-01-18 ENCOUNTER — Other Ambulatory Visit: Payer: Self-pay

## 2020-01-18 DIAGNOSIS — Z1231 Encounter for screening mammogram for malignant neoplasm of breast: Secondary | ICD-10-CM | POA: Diagnosis not present

## 2020-01-18 DIAGNOSIS — M8588 Other specified disorders of bone density and structure, other site: Secondary | ICD-10-CM

## 2020-01-19 DIAGNOSIS — M5416 Radiculopathy, lumbar region: Secondary | ICD-10-CM | POA: Diagnosis not present

## 2020-02-01 ENCOUNTER — Other Ambulatory Visit (HOSPITAL_COMMUNITY): Payer: Self-pay | Admitting: Family Medicine

## 2020-02-25 ENCOUNTER — Ambulatory Visit: Payer: PPO | Attending: Internal Medicine

## 2020-02-25 DIAGNOSIS — Z23 Encounter for immunization: Secondary | ICD-10-CM

## 2020-02-25 NOTE — Progress Notes (Signed)
° °  Covid-19 Vaccination Clinic  Name:  Taylor Richards    MRN: [id number] DOB: 07-19-1945  02/25/2020  Taylor Richards was observed post Covid-19 immunization for 15 minutes without incident. She was provided with Vaccine Information Sheet and instruction to access the V-Safe system.   Taylor Richards was instructed to call 911 with any severe reactions post vaccine:  Difficulty breathing   Swelling of face and throat   A fast heartbeat   A bad rash all over body   Dizziness and weakness

## 2020-03-26 ENCOUNTER — Other Ambulatory Visit (HOSPITAL_COMMUNITY): Payer: Self-pay | Admitting: Family Medicine

## 2020-04-09 ENCOUNTER — Ambulatory Visit
Admission: RE | Admit: 2020-04-09 | Discharge: 2020-04-09 | Disposition: A | Discharge status: Home / Self Care | Payer: PPO | Source: Ambulatory Visit | Attending: Family Medicine | Admitting: Family Medicine

## 2020-04-09 ENCOUNTER — Other Ambulatory Visit: Payer: Self-pay

## 2020-04-09 DIAGNOSIS — M8588 Other specified disorders of bone density and structure, other site: Secondary | ICD-10-CM

## 2020-04-09 DIAGNOSIS — M85852 Other specified disorders of bone density and structure, left thigh: Secondary | ICD-10-CM | POA: Diagnosis not present

## 2020-04-09 DIAGNOSIS — Z780 Asymptomatic menopausal state: Secondary | ICD-10-CM | POA: Diagnosis not present

## 2020-04-12 DIAGNOSIS — M67911 Unspecified disorder of synovium and tendon, right shoulder: Secondary | ICD-10-CM | POA: Diagnosis not present

## 2020-04-19 DIAGNOSIS — M25511 Pain in right shoulder: Secondary | ICD-10-CM | POA: Diagnosis not present

## 2020-05-04 DIAGNOSIS — M75121 Complete rotator cuff tear or rupture of right shoulder, not specified as traumatic: Secondary | ICD-10-CM | POA: Diagnosis not present

## 2020-05-25 DIAGNOSIS — M75121 Complete rotator cuff tear or rupture of right shoulder, not specified as traumatic: Secondary | ICD-10-CM | POA: Diagnosis not present

## 2020-07-11 DIAGNOSIS — K219 Gastro-esophageal reflux disease without esophagitis: Secondary | ICD-10-CM | POA: Diagnosis not present

## 2020-07-11 DIAGNOSIS — E039 Hypothyroidism, unspecified: Secondary | ICD-10-CM | POA: Diagnosis not present

## 2020-07-11 DIAGNOSIS — E782 Mixed hyperlipidemia: Secondary | ICD-10-CM | POA: Diagnosis not present

## 2020-07-11 DIAGNOSIS — E669 Obesity, unspecified: Secondary | ICD-10-CM | POA: Diagnosis not present

## 2020-07-11 DIAGNOSIS — I10 Essential (primary) hypertension: Secondary | ICD-10-CM | POA: Diagnosis not present

## 2020-07-11 DIAGNOSIS — L299 Pruritus, unspecified: Secondary | ICD-10-CM | POA: Diagnosis not present

## 2020-07-11 DIAGNOSIS — E559 Vitamin D deficiency, unspecified: Secondary | ICD-10-CM | POA: Diagnosis not present

## 2020-07-11 DIAGNOSIS — M179 Osteoarthritis of knee, unspecified: Secondary | ICD-10-CM | POA: Diagnosis not present

## 2020-08-07 ENCOUNTER — Other Ambulatory Visit (HOSPITAL_COMMUNITY): Payer: Self-pay

## 2020-08-07 MED FILL — Ergocalciferol Cap 1.25 MG (50000 Unit): ORAL | 84 days supply | Qty: 12 | Fill #0 | Status: AC

## 2020-08-07 MED FILL — Levothyroxine Sodium Tab 50 MCG: ORAL | 90 days supply | Qty: 90 | Fill #0 | Status: AC

## 2020-08-30 DIAGNOSIS — Q100 Congenital ptosis: Secondary | ICD-10-CM | POA: Diagnosis not present

## 2020-08-30 DIAGNOSIS — H35373 Puckering of macula, bilateral: Secondary | ICD-10-CM | POA: Diagnosis not present

## 2020-08-30 DIAGNOSIS — H40013 Open angle with borderline findings, low risk, bilateral: Secondary | ICD-10-CM | POA: Diagnosis not present

## 2020-08-30 DIAGNOSIS — H25813 Combined forms of age-related cataract, bilateral: Secondary | ICD-10-CM | POA: Diagnosis not present

## 2020-08-30 DIAGNOSIS — H10413 Chronic giant papillary conjunctivitis, bilateral: Secondary | ICD-10-CM | POA: Diagnosis not present

## 2020-08-30 DIAGNOSIS — H04123 Dry eye syndrome of bilateral lacrimal glands: Secondary | ICD-10-CM | POA: Diagnosis not present

## 2020-09-03 ENCOUNTER — Other Ambulatory Visit (HOSPITAL_COMMUNITY): Payer: Self-pay

## 2020-09-03 MED FILL — Amlodipine Besylate Tab 5 MG (Base Equivalent): ORAL | 90 days supply | Qty: 90 | Fill #0 | Status: AC

## 2020-09-03 MED FILL — Lisinopril & Hydrochlorothiazide Tab 20-25 MG: ORAL | 90 days supply | Qty: 90 | Fill #0 | Status: AC

## 2020-09-06 ENCOUNTER — Other Ambulatory Visit (HOSPITAL_COMMUNITY): Payer: Self-pay

## 2020-09-06 MED ORDER — POTASSIUM CHLORIDE ER 10 MEQ PO TBCR
EXTENDED_RELEASE_TABLET | ORAL | 1 refills | Status: AC
  Filled 2020-09-06: qty 90, 90d supply, fill #0
  Filled 2020-12-19 – 2021-03-20 (×3): qty 90, 90d supply, fill #1

## 2020-10-04 ENCOUNTER — Other Ambulatory Visit (HOSPITAL_COMMUNITY): Payer: Self-pay

## 2020-10-05 ENCOUNTER — Other Ambulatory Visit (HOSPITAL_COMMUNITY): Payer: Self-pay

## 2020-11-29 DIAGNOSIS — L237 Allergic contact dermatitis due to plants, except food: Secondary | ICD-10-CM | POA: Diagnosis not present

## 2020-12-03 ENCOUNTER — Other Ambulatory Visit (HOSPITAL_COMMUNITY): Payer: Self-pay

## 2020-12-04 ENCOUNTER — Other Ambulatory Visit (HOSPITAL_COMMUNITY): Payer: Self-pay

## 2020-12-04 MED ORDER — VITAMIN D (ERGOCALCIFEROL) 1.25 MG (50000 UNIT) PO CAPS
50000.0000 [IU] | ORAL_CAPSULE | ORAL | 2 refills | Status: AC
  Filled 2020-12-04: qty 12, 84d supply, fill #0
  Filled 2021-03-05: qty 12, 84d supply, fill #1
  Filled 2021-05-13: qty 12, 84d supply, fill #2
  Filled 2021-05-13: qty 4, 28d supply, fill #2

## 2020-12-04 MED ORDER — LEVOTHYROXINE SODIUM 50 MCG PO TABS
50.0000 ug | ORAL_TABLET | Freq: Every morning | ORAL | 0 refills | Status: DC
  Filled 2020-12-04: qty 66, 66d supply, fill #0
  Filled 2020-12-04: qty 24, 24d supply, fill #0

## 2020-12-19 ENCOUNTER — Other Ambulatory Visit (HOSPITAL_COMMUNITY): Payer: Self-pay

## 2020-12-19 MED ORDER — POTASSIUM CHLORIDE CRYS ER 10 MEQ PO TBCR
10.0000 meq | EXTENDED_RELEASE_TABLET | Freq: Every day | ORAL | 0 refills | Status: DC
  Filled 2020-12-19: qty 90, 90d supply, fill #0

## 2020-12-19 MED ORDER — AMLODIPINE BESYLATE 5 MG PO TABS
5.0000 mg | ORAL_TABLET | Freq: Every day | ORAL | 0 refills | Status: DC
  Filled 2020-12-19: qty 90, 90d supply, fill #0

## 2020-12-20 ENCOUNTER — Other Ambulatory Visit (HOSPITAL_COMMUNITY): Payer: Self-pay

## 2020-12-20 MED ORDER — LISINOPRIL-HYDROCHLOROTHIAZIDE 20-25 MG PO TABS
1.0000 | ORAL_TABLET | Freq: Every morning | ORAL | 0 refills | Status: DC
  Filled 2020-12-20: qty 90, 90d supply, fill #0

## 2020-12-26 ENCOUNTER — Other Ambulatory Visit: Payer: Self-pay | Admitting: Family Medicine

## 2020-12-26 ENCOUNTER — Other Ambulatory Visit: Payer: Self-pay

## 2020-12-26 DIAGNOSIS — Z1231 Encounter for screening mammogram for malignant neoplasm of breast: Secondary | ICD-10-CM

## 2021-01-20 DIAGNOSIS — S39012A Strain of muscle, fascia and tendon of lower back, initial encounter: Secondary | ICD-10-CM | POA: Diagnosis not present

## 2021-01-20 DIAGNOSIS — M5441 Lumbago with sciatica, right side: Secondary | ICD-10-CM | POA: Diagnosis not present

## 2021-01-20 DIAGNOSIS — M5442 Lumbago with sciatica, left side: Secondary | ICD-10-CM | POA: Diagnosis not present

## 2021-01-21 ENCOUNTER — Other Ambulatory Visit (HOSPITAL_BASED_OUTPATIENT_CLINIC_OR_DEPARTMENT_OTHER): Payer: Self-pay

## 2021-01-21 ENCOUNTER — Other Ambulatory Visit: Payer: Self-pay

## 2021-01-21 ENCOUNTER — Ambulatory Visit: Payer: PPO | Attending: Internal Medicine

## 2021-01-21 DIAGNOSIS — Z23 Encounter for immunization: Secondary | ICD-10-CM

## 2021-01-21 MED ORDER — PFIZER COVID-19 VAC BIVALENT 30 MCG/0.3ML IM SUSP
INTRAMUSCULAR | 0 refills | Status: DC
  Filled 2021-01-21: qty 0.3, 1d supply, fill #0

## 2021-01-21 NOTE — Progress Notes (Signed)
   Covid-19 Vaccination Clinic  Name:  Taylor Richards    MRN: [id number] DOB: 11-Mar-1946  01/21/2021  Taylor Richards was observed post Covid-19 immunization for 15 minutes without incident. She was provided with Vaccine Information Sheet and instruction to access the V-Safe system.   Taylor Richards was instructed to call 911 with any severe reactions post vaccine: Difficulty breathing  Swelling of face and throat  A fast heartbeat  A bad rash all over body  Dizziness and weakness

## 2021-01-28 DIAGNOSIS — M5416 Radiculopathy, lumbar region: Secondary | ICD-10-CM | POA: Diagnosis not present

## 2021-02-06 ENCOUNTER — Ambulatory Visit
Admission: RE | Admit: 2021-02-06 | Discharge: 2021-02-06 | Disposition: A | Discharge status: Home / Self Care | Payer: PPO | Source: Ambulatory Visit | Attending: Family Medicine | Admitting: Family Medicine

## 2021-02-06 ENCOUNTER — Ambulatory Visit: Payer: PPO

## 2021-02-06 ENCOUNTER — Other Ambulatory Visit: Payer: Self-pay

## 2021-02-06 DIAGNOSIS — Z1231 Encounter for screening mammogram for malignant neoplasm of breast: Secondary | ICD-10-CM

## 2021-02-13 DIAGNOSIS — M5416 Radiculopathy, lumbar region: Secondary | ICD-10-CM | POA: Diagnosis not present

## 2021-02-14 DIAGNOSIS — G4700 Insomnia, unspecified: Secondary | ICD-10-CM | POA: Diagnosis not present

## 2021-02-14 DIAGNOSIS — Z23 Encounter for immunization: Secondary | ICD-10-CM | POA: Diagnosis not present

## 2021-02-14 DIAGNOSIS — Z0000 Encounter for general adult medical examination without abnormal findings: Secondary | ICD-10-CM | POA: Diagnosis not present

## 2021-02-14 DIAGNOSIS — I10 Essential (primary) hypertension: Secondary | ICD-10-CM | POA: Diagnosis not present

## 2021-02-14 DIAGNOSIS — E559 Vitamin D deficiency, unspecified: Secondary | ICD-10-CM | POA: Diagnosis not present

## 2021-02-14 DIAGNOSIS — E669 Obesity, unspecified: Secondary | ICD-10-CM | POA: Diagnosis not present

## 2021-02-14 DIAGNOSIS — E039 Hypothyroidism, unspecified: Secondary | ICD-10-CM | POA: Diagnosis not present

## 2021-02-14 DIAGNOSIS — M179 Osteoarthritis of knee, unspecified: Secondary | ICD-10-CM | POA: Diagnosis not present

## 2021-02-14 DIAGNOSIS — K219 Gastro-esophageal reflux disease without esophagitis: Secondary | ICD-10-CM | POA: Diagnosis not present

## 2021-02-14 DIAGNOSIS — E782 Mixed hyperlipidemia: Secondary | ICD-10-CM | POA: Diagnosis not present

## 2021-03-05 ENCOUNTER — Other Ambulatory Visit (HOSPITAL_COMMUNITY): Payer: Self-pay

## 2021-03-05 MED ORDER — LEVOTHYROXINE SODIUM 50 MCG PO TABS
50.0000 ug | ORAL_TABLET | Freq: Every morning | ORAL | 1 refills | Status: AC
  Filled 2021-03-05: qty 90, 90d supply, fill #0
  Filled 2021-06-03: qty 90, 90d supply, fill #1

## 2021-03-08 DIAGNOSIS — M5416 Radiculopathy, lumbar region: Secondary | ICD-10-CM | POA: Diagnosis not present

## 2021-03-20 ENCOUNTER — Other Ambulatory Visit (HOSPITAL_COMMUNITY): Payer: Self-pay

## 2021-03-20 MED ORDER — AMLODIPINE BESYLATE 5 MG PO TABS
5.0000 mg | ORAL_TABLET | Freq: Every day | ORAL | 0 refills | Status: AC
  Filled 2021-03-20: qty 90, 90d supply, fill #0

## 2021-03-20 MED ORDER — LISINOPRIL-HYDROCHLOROTHIAZIDE 20-25 MG PO TABS
1.0000 | ORAL_TABLET | Freq: Every morning | ORAL | 0 refills | Status: DC
  Filled 2021-03-20: qty 90, 90d supply, fill #0

## 2021-03-27 DIAGNOSIS — M5416 Radiculopathy, lumbar region: Secondary | ICD-10-CM | POA: Diagnosis not present

## 2021-04-15 DIAGNOSIS — M5416 Radiculopathy, lumbar region: Secondary | ICD-10-CM | POA: Diagnosis not present

## 2021-05-13 ENCOUNTER — Other Ambulatory Visit (HOSPITAL_COMMUNITY): Payer: Self-pay

## 2021-06-03 ENCOUNTER — Other Ambulatory Visit (HOSPITAL_COMMUNITY): Payer: Self-pay

## 2021-06-03 DIAGNOSIS — M48062 Spinal stenosis, lumbar region with neurogenic claudication: Secondary | ICD-10-CM | POA: Diagnosis not present

## 2021-06-05 DIAGNOSIS — Z01419 Encounter for gynecological examination (general) (routine) without abnormal findings: Secondary | ICD-10-CM | POA: Diagnosis not present

## 2021-07-02 DIAGNOSIS — M545 Low back pain, unspecified: Secondary | ICD-10-CM | POA: Diagnosis not present

## 2021-07-03 DIAGNOSIS — M545 Low back pain, unspecified: Secondary | ICD-10-CM | POA: Diagnosis not present

## 2021-07-08 DIAGNOSIS — M545 Low back pain, unspecified: Secondary | ICD-10-CM | POA: Diagnosis not present

## 2021-07-10 DIAGNOSIS — M545 Low back pain, unspecified: Secondary | ICD-10-CM | POA: Diagnosis not present

## 2021-07-17 DIAGNOSIS — M545 Low back pain, unspecified: Secondary | ICD-10-CM | POA: Diagnosis not present

## 2021-07-24 DIAGNOSIS — M545 Low back pain, unspecified: Secondary | ICD-10-CM | POA: Diagnosis not present

## 2021-07-31 DIAGNOSIS — M545 Low back pain, unspecified: Secondary | ICD-10-CM | POA: Diagnosis not present

## 2021-08-07 DIAGNOSIS — M545 Low back pain, unspecified: Secondary | ICD-10-CM | POA: Diagnosis not present

## 2021-08-14 DIAGNOSIS — M545 Low back pain, unspecified: Secondary | ICD-10-CM | POA: Diagnosis not present

## 2021-08-22 DIAGNOSIS — E559 Vitamin D deficiency, unspecified: Secondary | ICD-10-CM | POA: Diagnosis not present

## 2021-08-22 DIAGNOSIS — M179 Osteoarthritis of knee, unspecified: Secondary | ICD-10-CM | POA: Diagnosis not present

## 2021-08-22 DIAGNOSIS — E039 Hypothyroidism, unspecified: Secondary | ICD-10-CM | POA: Diagnosis not present

## 2021-08-22 DIAGNOSIS — I10 Essential (primary) hypertension: Secondary | ICD-10-CM | POA: Diagnosis not present

## 2021-08-22 DIAGNOSIS — E782 Mixed hyperlipidemia: Secondary | ICD-10-CM | POA: Diagnosis not present

## 2021-08-22 DIAGNOSIS — E669 Obesity, unspecified: Secondary | ICD-10-CM | POA: Diagnosis not present

## 2021-08-22 DIAGNOSIS — K219 Gastro-esophageal reflux disease without esophagitis: Secondary | ICD-10-CM | POA: Diagnosis not present

## 2021-08-22 DIAGNOSIS — M792 Neuralgia and neuritis, unspecified: Secondary | ICD-10-CM | POA: Diagnosis not present

## 2021-08-23 DIAGNOSIS — M545 Low back pain, unspecified: Secondary | ICD-10-CM | POA: Diagnosis not present

## 2021-11-20 DIAGNOSIS — E785 Hyperlipidemia, unspecified: Secondary | ICD-10-CM | POA: Diagnosis not present

## 2021-11-20 DIAGNOSIS — E559 Vitamin D deficiency, unspecified: Secondary | ICD-10-CM | POA: Diagnosis not present

## 2021-11-20 DIAGNOSIS — I10 Essential (primary) hypertension: Secondary | ICD-10-CM | POA: Diagnosis not present

## 2021-11-20 DIAGNOSIS — G8929 Other chronic pain: Secondary | ICD-10-CM | POA: Diagnosis not present

## 2021-11-20 DIAGNOSIS — E039 Hypothyroidism, unspecified: Secondary | ICD-10-CM | POA: Diagnosis not present

## 2021-11-20 DIAGNOSIS — E669 Obesity, unspecified: Secondary | ICD-10-CM | POA: Diagnosis not present

## 2021-11-20 DIAGNOSIS — M1990 Unspecified osteoarthritis, unspecified site: Secondary | ICD-10-CM | POA: Diagnosis not present

## 2021-11-20 DIAGNOSIS — R32 Unspecified urinary incontinence: Secondary | ICD-10-CM | POA: Diagnosis not present

## 2021-11-20 DIAGNOSIS — E876 Hypokalemia: Secondary | ICD-10-CM | POA: Diagnosis not present

## 2022-01-15 ENCOUNTER — Other Ambulatory Visit: Payer: Self-pay | Admitting: Family Medicine

## 2022-01-15 DIAGNOSIS — Z1231 Encounter for screening mammogram for malignant neoplasm of breast: Secondary | ICD-10-CM

## 2022-01-25 DIAGNOSIS — Z23 Encounter for immunization: Secondary | ICD-10-CM | POA: Diagnosis not present

## 2022-01-30 DIAGNOSIS — H40013 Open angle with borderline findings, low risk, bilateral: Secondary | ICD-10-CM | POA: Diagnosis not present

## 2022-01-30 DIAGNOSIS — H35373 Puckering of macula, bilateral: Secondary | ICD-10-CM | POA: Diagnosis not present

## 2022-01-30 DIAGNOSIS — Q100 Congenital ptosis: Secondary | ICD-10-CM | POA: Diagnosis not present

## 2022-01-30 DIAGNOSIS — H10413 Chronic giant papillary conjunctivitis, bilateral: Secondary | ICD-10-CM | POA: Diagnosis not present

## 2022-01-30 DIAGNOSIS — H25813 Combined forms of age-related cataract, bilateral: Secondary | ICD-10-CM | POA: Diagnosis not present

## 2022-01-30 DIAGNOSIS — H04123 Dry eye syndrome of bilateral lacrimal glands: Secondary | ICD-10-CM | POA: Diagnosis not present

## 2022-02-12 ENCOUNTER — Ambulatory Visit: Payer: PPO

## 2022-02-20 ENCOUNTER — Other Ambulatory Visit: Payer: Self-pay | Admitting: Family Medicine

## 2022-02-20 DIAGNOSIS — E782 Mixed hyperlipidemia: Secondary | ICD-10-CM | POA: Diagnosis not present

## 2022-02-20 DIAGNOSIS — E669 Obesity, unspecified: Secondary | ICD-10-CM | POA: Diagnosis not present

## 2022-02-20 DIAGNOSIS — E559 Vitamin D deficiency, unspecified: Secondary | ICD-10-CM | POA: Diagnosis not present

## 2022-02-20 DIAGNOSIS — I10 Essential (primary) hypertension: Secondary | ICD-10-CM | POA: Diagnosis not present

## 2022-02-20 DIAGNOSIS — E039 Hypothyroidism, unspecified: Secondary | ICD-10-CM | POA: Diagnosis not present

## 2022-02-20 DIAGNOSIS — M8580 Other specified disorders of bone density and structure, unspecified site: Secondary | ICD-10-CM

## 2022-02-20 DIAGNOSIS — Z0000 Encounter for general adult medical examination without abnormal findings: Secondary | ICD-10-CM | POA: Diagnosis not present

## 2022-02-20 DIAGNOSIS — M5441 Lumbago with sciatica, right side: Secondary | ICD-10-CM | POA: Diagnosis not present

## 2022-03-13 ENCOUNTER — Ambulatory Visit
Admission: RE | Admit: 2022-03-13 | Discharge: 2022-03-13 | Disposition: A | Discharge status: Home / Self Care | Payer: PPO | Source: Ambulatory Visit | Attending: Family Medicine | Admitting: Family Medicine

## 2022-03-13 DIAGNOSIS — Z1231 Encounter for screening mammogram for malignant neoplasm of breast: Secondary | ICD-10-CM | POA: Diagnosis not present

## 2022-03-18 IMAGING — MG MM DIGITAL SCREENING BILAT W/ TOMO AND CAD
8 series · 8 of 24 positions shown · non-contrast
Comparison: Previous exam(s).

CLINICAL DATA: Screening.

EXAM:
DIGITAL SCREENING BILATERAL MAMMOGRAM WITH TOMOSYNTHESIS AND CAD
TECHNIQUE: Bilateral screening digital craniocaudal and mediolateral oblique
mammograms were obtained. Bilateral screening digital breast
tomosynthesis was performed. The images were evaluated with
computer-aided detection.

[L CC synth-2D]
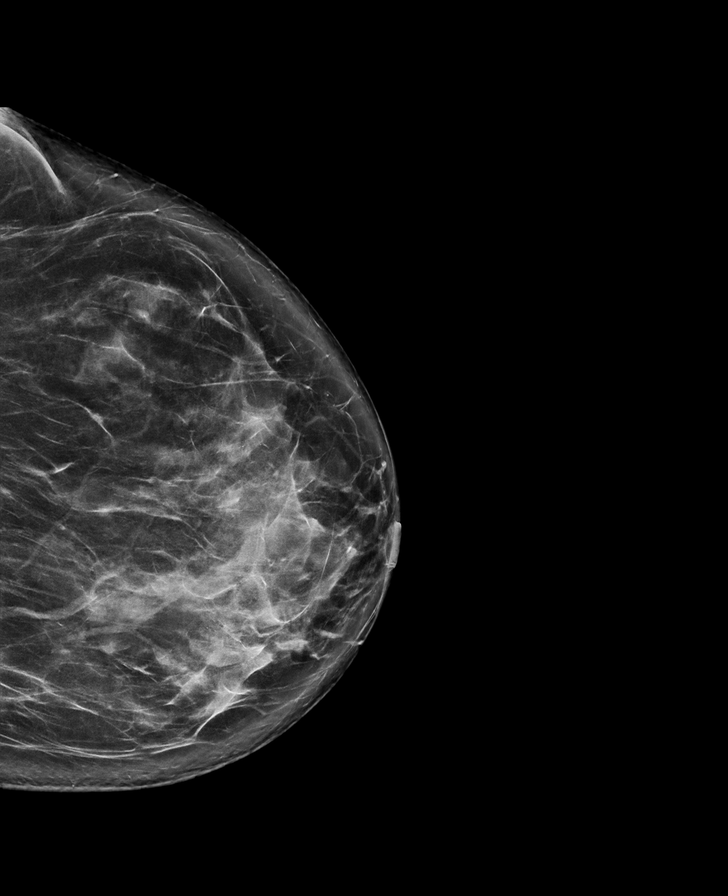

[R MLO synth-2D]
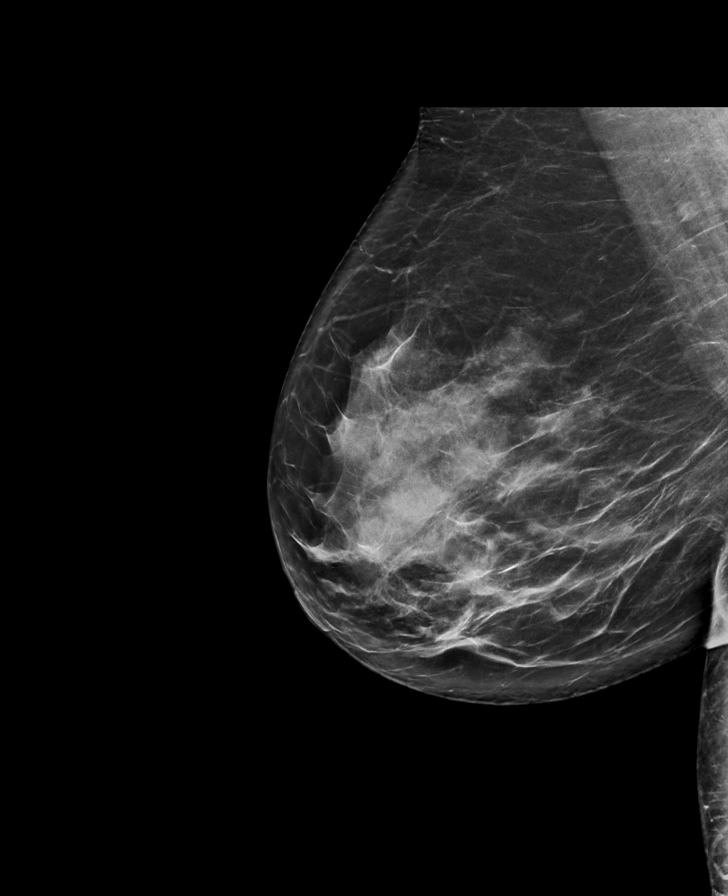

[R CC synth-2D]
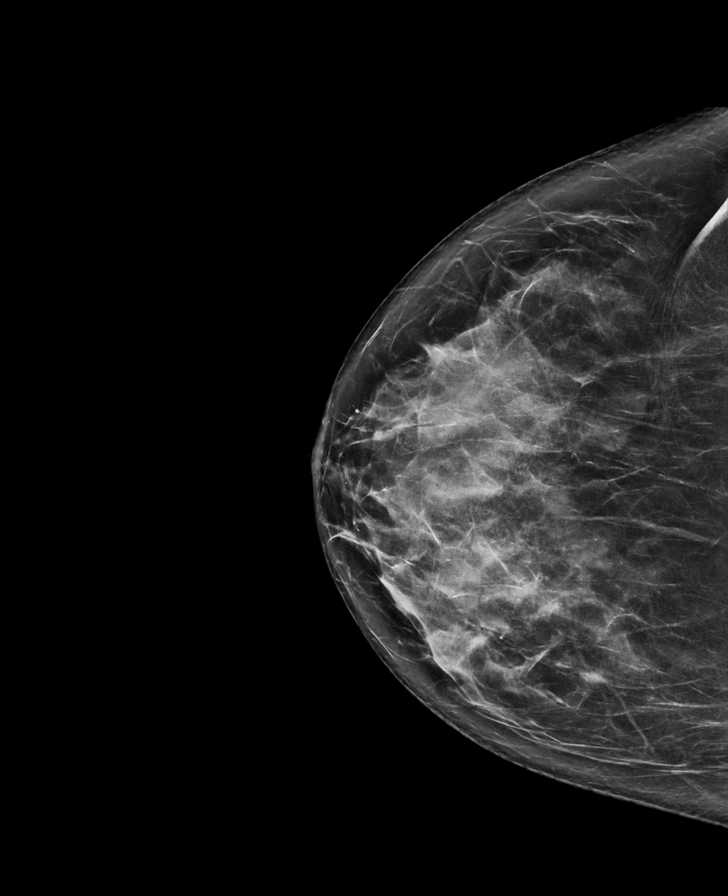

[L MLO synth-2D]
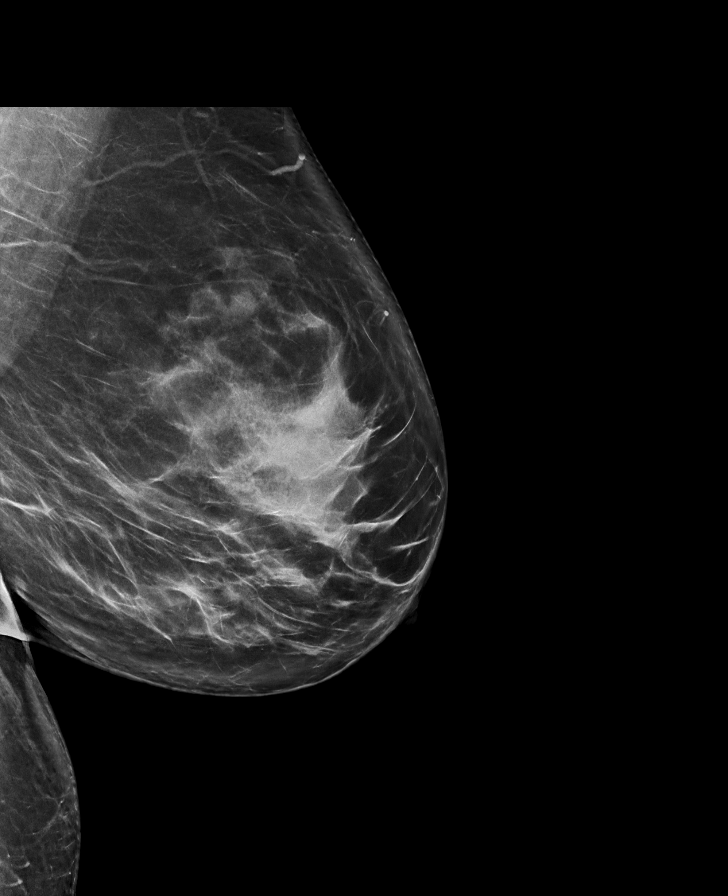

[L CC tomo · tomo slice 43/84.0]
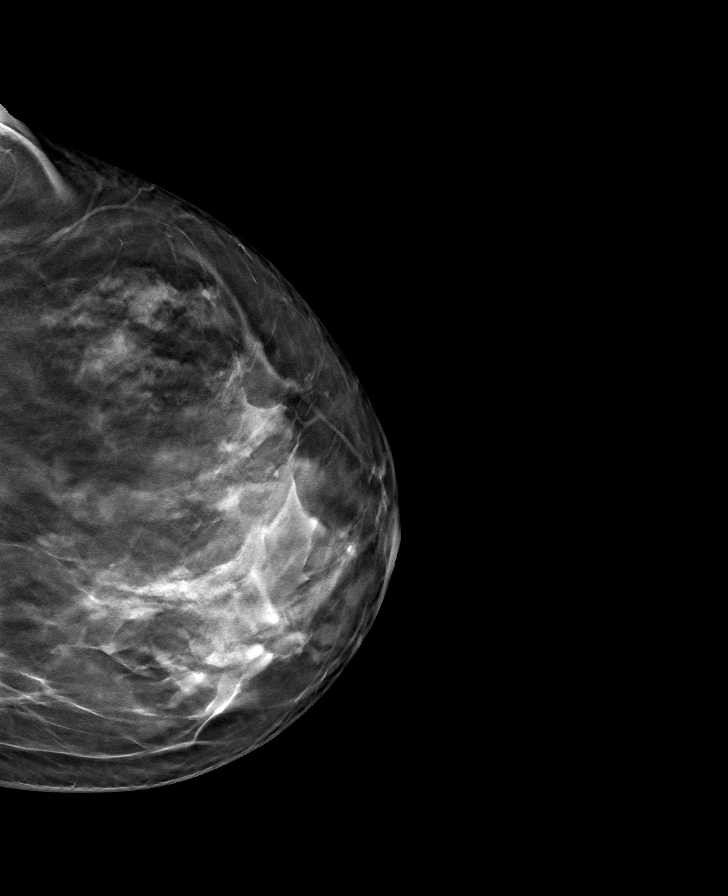

[R MLO tomo · tomo slice 45/88.0]
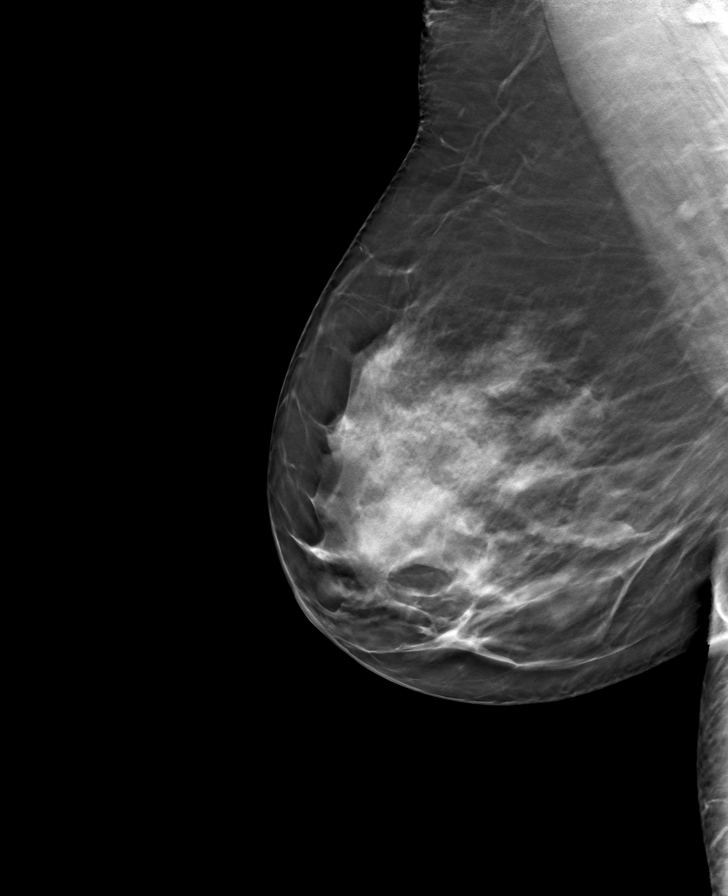

[R CC tomo · tomo slice 39/78.0]
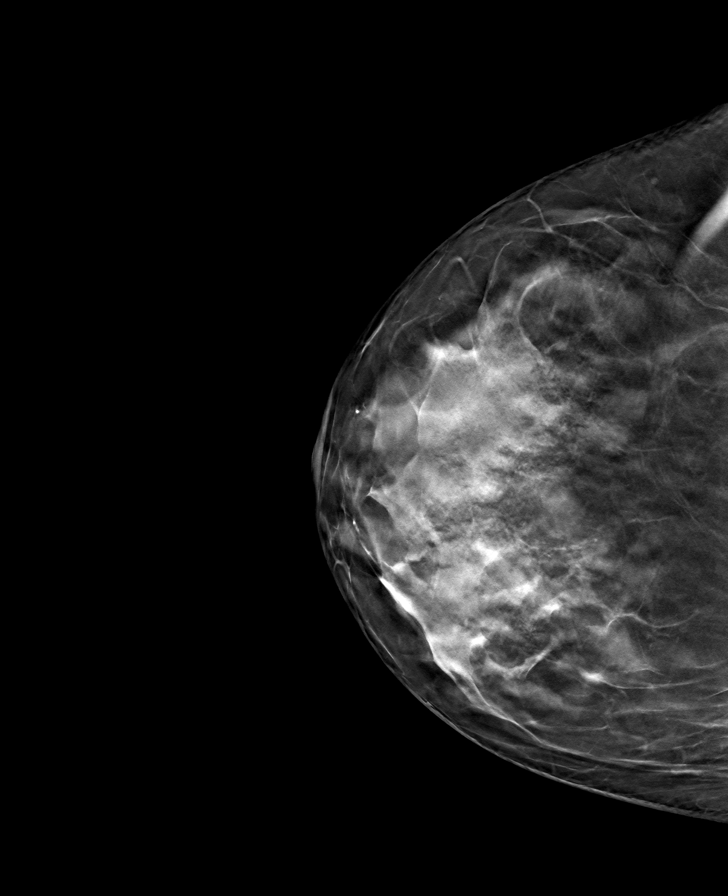

[L MLO tomo · tomo slice 45/90.0]
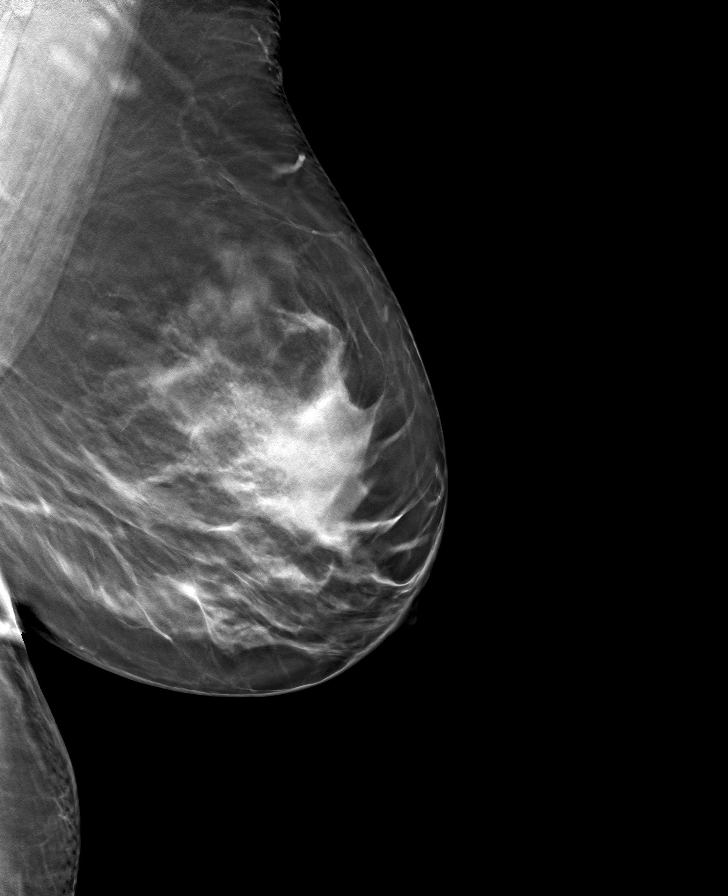

[8 of 24 positions shown; findings below may reference images not displayed]

ACR Breast Density Category c: The breast tissue is heterogeneously
dense, which may obscure small masses.
FINDINGS: There are no findings suspicious for malignancy.
IMPRESSION: No mammographic evidence of malignancy. A result letter of this
screening mammogram will be mailed directly to the patient.

RECOMMENDATION:
Screening mammogram in one year. (Code:[id number])

BI-RADS CATEGORY  1: Negative.

## 2022-03-19 ENCOUNTER — Other Ambulatory Visit: Payer: Self-pay | Admitting: Family Medicine

## 2022-03-19 ENCOUNTER — Ambulatory Visit
Admission: RE | Admit: 2022-03-19 | Discharge: 2022-03-19 | Disposition: A | Discharge status: Home / Self Care | Payer: PPO | Source: Ambulatory Visit | Attending: Family Medicine | Admitting: Family Medicine

## 2022-03-19 DIAGNOSIS — M25561 Pain in right knee: Secondary | ICD-10-CM

## 2022-03-19 DIAGNOSIS — M1711 Unilateral primary osteoarthritis, right knee: Secondary | ICD-10-CM | POA: Diagnosis not present

## 2022-03-19 DIAGNOSIS — R7989 Other specified abnormal findings of blood chemistry: Secondary | ICD-10-CM | POA: Diagnosis not present

## 2022-03-19 DIAGNOSIS — M25562 Pain in left knee: Secondary | ICD-10-CM | POA: Diagnosis not present

## 2022-03-19 DIAGNOSIS — M1712 Unilateral primary osteoarthritis, left knee: Secondary | ICD-10-CM | POA: Diagnosis not present

## 2022-03-19 DIAGNOSIS — M170 Bilateral primary osteoarthritis of knee: Secondary | ICD-10-CM

## 2022-03-19 DIAGNOSIS — R945 Abnormal results of liver function studies: Secondary | ICD-10-CM | POA: Diagnosis not present

## 2022-03-26 ENCOUNTER — Other Ambulatory Visit: Payer: Self-pay | Admitting: Family Medicine

## 2022-03-26 DIAGNOSIS — R748 Abnormal levels of other serum enzymes: Secondary | ICD-10-CM

## 2022-04-02 DIAGNOSIS — M1711 Unilateral primary osteoarthritis, right knee: Secondary | ICD-10-CM | POA: Diagnosis not present

## 2022-04-09 ENCOUNTER — Ambulatory Visit
Admission: RE | Admit: 2022-04-09 | Discharge: 2022-04-09 | Disposition: A | Discharge status: Home / Self Care | Payer: PPO | Source: Ambulatory Visit | Attending: Family Medicine | Admitting: Family Medicine

## 2022-04-09 DIAGNOSIS — R7989 Other specified abnormal findings of blood chemistry: Secondary | ICD-10-CM | POA: Diagnosis not present

## 2022-04-09 DIAGNOSIS — R748 Abnormal levels of other serum enzymes: Secondary | ICD-10-CM

## 2022-04-09 DIAGNOSIS — K8020 Calculus of gallbladder without cholecystitis without obstruction: Secondary | ICD-10-CM | POA: Diagnosis not present

## 2022-04-09 DIAGNOSIS — K760 Fatty (change of) liver, not elsewhere classified: Secondary | ICD-10-CM | POA: Diagnosis not present

## 2022-04-16 DIAGNOSIS — K760 Fatty (change of) liver, not elsewhere classified: Secondary | ICD-10-CM | POA: Diagnosis not present

## 2022-07-04 DIAGNOSIS — R7303 Prediabetes: Secondary | ICD-10-CM | POA: Diagnosis not present

## 2022-07-04 DIAGNOSIS — M179 Osteoarthritis of knee, unspecified: Secondary | ICD-10-CM | POA: Diagnosis not present

## 2022-07-04 DIAGNOSIS — K760 Fatty (change of) liver, not elsewhere classified: Secondary | ICD-10-CM | POA: Diagnosis not present

## 2022-07-08 DIAGNOSIS — M170 Bilateral primary osteoarthritis of knee: Secondary | ICD-10-CM | POA: Diagnosis not present

## 2022-07-08 DIAGNOSIS — M1712 Unilateral primary osteoarthritis, left knee: Secondary | ICD-10-CM | POA: Diagnosis not present

## 2022-07-23 DIAGNOSIS — M545 Low back pain, unspecified: Secondary | ICD-10-CM | POA: Diagnosis not present

## 2022-08-01 DIAGNOSIS — N390 Urinary tract infection, site not specified: Secondary | ICD-10-CM | POA: Diagnosis not present

## 2022-08-07 DIAGNOSIS — R350 Frequency of micturition: Secondary | ICD-10-CM | POA: Diagnosis not present

## 2022-08-07 DIAGNOSIS — R8290 Unspecified abnormal findings in urine: Secondary | ICD-10-CM | POA: Diagnosis not present

## 2022-08-08 ENCOUNTER — Ambulatory Visit
Admission: RE | Admit: 2022-08-08 | Discharge: 2022-08-08 | Disposition: A | Discharge status: Home / Self Care | Payer: PPO | Source: Ambulatory Visit | Attending: Family Medicine | Admitting: Family Medicine

## 2022-08-08 DIAGNOSIS — M8580 Other specified disorders of bone density and structure, unspecified site: Secondary | ICD-10-CM

## 2022-08-08 DIAGNOSIS — Z780 Asymptomatic menopausal state: Secondary | ICD-10-CM | POA: Diagnosis not present

## 2022-08-12 DIAGNOSIS — M1711 Unilateral primary osteoarthritis, right knee: Secondary | ICD-10-CM | POA: Diagnosis not present

## 2022-08-12 DIAGNOSIS — M170 Bilateral primary osteoarthritis of knee: Secondary | ICD-10-CM | POA: Diagnosis not present

## 2022-09-17 DIAGNOSIS — N390 Urinary tract infection, site not specified: Secondary | ICD-10-CM | POA: Diagnosis not present

## 2022-09-17 DIAGNOSIS — M8580 Other specified disorders of bone density and structure, unspecified site: Secondary | ICD-10-CM | POA: Diagnosis not present

## 2022-09-17 DIAGNOSIS — K219 Gastro-esophageal reflux disease without esophagitis: Secondary | ICD-10-CM | POA: Diagnosis not present

## 2022-09-17 DIAGNOSIS — E559 Vitamin D deficiency, unspecified: Secondary | ICD-10-CM | POA: Diagnosis not present

## 2022-09-17 DIAGNOSIS — K760 Fatty (change of) liver, not elsewhere classified: Secondary | ICD-10-CM | POA: Diagnosis not present

## 2022-09-17 DIAGNOSIS — E782 Mixed hyperlipidemia: Secondary | ICD-10-CM | POA: Diagnosis not present

## 2022-09-17 DIAGNOSIS — M179 Osteoarthritis of knee, unspecified: Secondary | ICD-10-CM | POA: Diagnosis not present

## 2022-09-17 DIAGNOSIS — M545 Low back pain, unspecified: Secondary | ICD-10-CM | POA: Diagnosis not present

## 2022-09-17 DIAGNOSIS — R7303 Prediabetes: Secondary | ICD-10-CM | POA: Diagnosis not present

## 2022-09-17 DIAGNOSIS — E039 Hypothyroidism, unspecified: Secondary | ICD-10-CM | POA: Diagnosis not present

## 2022-09-17 DIAGNOSIS — E669 Obesity, unspecified: Secondary | ICD-10-CM | POA: Diagnosis not present

## 2022-09-17 DIAGNOSIS — I10 Essential (primary) hypertension: Secondary | ICD-10-CM | POA: Diagnosis not present

## 2022-12-16 DIAGNOSIS — E669 Obesity, unspecified: Secondary | ICD-10-CM | POA: Diagnosis not present

## 2022-12-16 DIAGNOSIS — E785 Hyperlipidemia, unspecified: Secondary | ICD-10-CM | POA: Diagnosis not present

## 2022-12-16 DIAGNOSIS — M1990 Unspecified osteoarthritis, unspecified site: Secondary | ICD-10-CM | POA: Diagnosis not present

## 2022-12-16 DIAGNOSIS — I10 Essential (primary) hypertension: Secondary | ICD-10-CM | POA: Diagnosis not present

## 2022-12-16 DIAGNOSIS — E876 Hypokalemia: Secondary | ICD-10-CM | POA: Diagnosis not present

## 2022-12-16 DIAGNOSIS — E559 Vitamin D deficiency, unspecified: Secondary | ICD-10-CM | POA: Diagnosis not present

## 2022-12-16 DIAGNOSIS — E538 Deficiency of other specified B group vitamins: Secondary | ICD-10-CM | POA: Diagnosis not present

## 2022-12-16 DIAGNOSIS — R32 Unspecified urinary incontinence: Secondary | ICD-10-CM | POA: Diagnosis not present

## 2022-12-16 DIAGNOSIS — G8929 Other chronic pain: Secondary | ICD-10-CM | POA: Diagnosis not present

## 2022-12-16 DIAGNOSIS — E039 Hypothyroidism, unspecified: Secondary | ICD-10-CM | POA: Diagnosis not present

## 2022-12-16 DIAGNOSIS — K219 Gastro-esophageal reflux disease without esophagitis: Secondary | ICD-10-CM | POA: Diagnosis not present

## 2022-12-31 DIAGNOSIS — M170 Bilateral primary osteoarthritis of knee: Secondary | ICD-10-CM | POA: Diagnosis not present

## 2023-01-29 DIAGNOSIS — M1711 Unilateral primary osteoarthritis, right knee: Secondary | ICD-10-CM | POA: Diagnosis not present

## 2023-02-11 DIAGNOSIS — M25562 Pain in left knee: Secondary | ICD-10-CM | POA: Diagnosis not present

## 2023-02-11 DIAGNOSIS — M25561 Pain in right knee: Secondary | ICD-10-CM | POA: Diagnosis not present

## 2023-02-12 DIAGNOSIS — H35373 Puckering of macula, bilateral: Secondary | ICD-10-CM | POA: Diagnosis not present

## 2023-02-12 DIAGNOSIS — H25813 Combined forms of age-related cataract, bilateral: Secondary | ICD-10-CM | POA: Diagnosis not present

## 2023-02-12 DIAGNOSIS — H10413 Chronic giant papillary conjunctivitis, bilateral: Secondary | ICD-10-CM | POA: Diagnosis not present

## 2023-02-12 DIAGNOSIS — H40013 Open angle with borderline findings, low risk, bilateral: Secondary | ICD-10-CM | POA: Diagnosis not present

## 2023-02-12 DIAGNOSIS — Q100 Congenital ptosis: Secondary | ICD-10-CM | POA: Diagnosis not present

## 2023-02-12 DIAGNOSIS — H04123 Dry eye syndrome of bilateral lacrimal glands: Secondary | ICD-10-CM | POA: Diagnosis not present

## 2023-02-16 DIAGNOSIS — M25562 Pain in left knee: Secondary | ICD-10-CM | POA: Diagnosis not present

## 2023-02-16 DIAGNOSIS — M25561 Pain in right knee: Secondary | ICD-10-CM | POA: Diagnosis not present

## 2023-02-19 DIAGNOSIS — M25562 Pain in left knee: Secondary | ICD-10-CM | POA: Diagnosis not present

## 2023-02-19 DIAGNOSIS — M1711 Unilateral primary osteoarthritis, right knee: Secondary | ICD-10-CM | POA: Diagnosis not present

## 2023-02-19 DIAGNOSIS — M25561 Pain in right knee: Secondary | ICD-10-CM | POA: Diagnosis not present

## 2023-02-23 DIAGNOSIS — M25562 Pain in left knee: Secondary | ICD-10-CM | POA: Diagnosis not present

## 2023-02-23 DIAGNOSIS — M25561 Pain in right knee: Secondary | ICD-10-CM | POA: Diagnosis not present

## 2023-02-25 DIAGNOSIS — E559 Vitamin D deficiency, unspecified: Secondary | ICD-10-CM | POA: Diagnosis not present

## 2023-02-25 DIAGNOSIS — M8588 Other specified disorders of bone density and structure, other site: Secondary | ICD-10-CM | POA: Diagnosis not present

## 2023-02-25 DIAGNOSIS — M179 Osteoarthritis of knee, unspecified: Secondary | ICD-10-CM | POA: Diagnosis not present

## 2023-02-25 DIAGNOSIS — E669 Obesity, unspecified: Secondary | ICD-10-CM | POA: Diagnosis not present

## 2023-02-25 DIAGNOSIS — E039 Hypothyroidism, unspecified: Secondary | ICD-10-CM | POA: Diagnosis not present

## 2023-02-25 DIAGNOSIS — Z0000 Encounter for general adult medical examination without abnormal findings: Secondary | ICD-10-CM | POA: Diagnosis not present

## 2023-02-25 DIAGNOSIS — E782 Mixed hyperlipidemia: Secondary | ICD-10-CM | POA: Diagnosis not present

## 2023-02-25 DIAGNOSIS — K760 Fatty (change of) liver, not elsewhere classified: Secondary | ICD-10-CM | POA: Diagnosis not present

## 2023-02-25 DIAGNOSIS — Z23 Encounter for immunization: Secondary | ICD-10-CM | POA: Diagnosis not present

## 2023-02-25 DIAGNOSIS — R7303 Prediabetes: Secondary | ICD-10-CM | POA: Diagnosis not present

## 2023-02-25 DIAGNOSIS — K219 Gastro-esophageal reflux disease without esophagitis: Secondary | ICD-10-CM | POA: Diagnosis not present

## 2023-02-25 DIAGNOSIS — I10 Essential (primary) hypertension: Secondary | ICD-10-CM | POA: Diagnosis not present

## 2023-02-26 DIAGNOSIS — M25561 Pain in right knee: Secondary | ICD-10-CM | POA: Diagnosis not present

## 2023-02-26 DIAGNOSIS — M25562 Pain in left knee: Secondary | ICD-10-CM | POA: Diagnosis not present

## 2023-03-02 DIAGNOSIS — M25562 Pain in left knee: Secondary | ICD-10-CM | POA: Diagnosis not present

## 2023-03-02 DIAGNOSIS — M25561 Pain in right knee: Secondary | ICD-10-CM | POA: Diagnosis not present

## 2023-03-05 DIAGNOSIS — M25562 Pain in left knee: Secondary | ICD-10-CM | POA: Diagnosis not present

## 2023-03-05 DIAGNOSIS — M25561 Pain in right knee: Secondary | ICD-10-CM | POA: Diagnosis not present

## 2023-03-09 DIAGNOSIS — M25561 Pain in right knee: Secondary | ICD-10-CM | POA: Diagnosis not present

## 2023-03-09 DIAGNOSIS — M25562 Pain in left knee: Secondary | ICD-10-CM | POA: Diagnosis not present

## 2023-03-11 ENCOUNTER — Other Ambulatory Visit: Payer: Self-pay | Admitting: Family Medicine

## 2023-03-11 DIAGNOSIS — Z1231 Encounter for screening mammogram for malignant neoplasm of breast: Secondary | ICD-10-CM

## 2023-03-12 DIAGNOSIS — M25561 Pain in right knee: Secondary | ICD-10-CM | POA: Diagnosis not present

## 2023-03-12 DIAGNOSIS — M25562 Pain in left knee: Secondary | ICD-10-CM | POA: Diagnosis not present

## 2023-03-16 DIAGNOSIS — M25561 Pain in right knee: Secondary | ICD-10-CM | POA: Diagnosis not present

## 2023-03-16 DIAGNOSIS — M25562 Pain in left knee: Secondary | ICD-10-CM | POA: Diagnosis not present

## 2023-03-19 DIAGNOSIS — M25562 Pain in left knee: Secondary | ICD-10-CM | POA: Diagnosis not present

## 2023-03-19 DIAGNOSIS — M25561 Pain in right knee: Secondary | ICD-10-CM | POA: Diagnosis not present

## 2023-03-23 DIAGNOSIS — M25562 Pain in left knee: Secondary | ICD-10-CM | POA: Diagnosis not present

## 2023-03-23 DIAGNOSIS — M25561 Pain in right knee: Secondary | ICD-10-CM | POA: Diagnosis not present

## 2023-03-30 DIAGNOSIS — M25561 Pain in right knee: Secondary | ICD-10-CM | POA: Diagnosis not present

## 2023-03-30 DIAGNOSIS — M25562 Pain in left knee: Secondary | ICD-10-CM | POA: Diagnosis not present

## 2023-04-02 DIAGNOSIS — M25561 Pain in right knee: Secondary | ICD-10-CM | POA: Diagnosis not present

## 2023-04-02 DIAGNOSIS — M25562 Pain in left knee: Secondary | ICD-10-CM | POA: Diagnosis not present

## 2023-04-06 DIAGNOSIS — M25561 Pain in right knee: Secondary | ICD-10-CM | POA: Diagnosis not present

## 2023-04-06 DIAGNOSIS — M25562 Pain in left knee: Secondary | ICD-10-CM | POA: Diagnosis not present

## 2023-04-09 DIAGNOSIS — M25562 Pain in left knee: Secondary | ICD-10-CM | POA: Diagnosis not present

## 2023-04-09 DIAGNOSIS — M25561 Pain in right knee: Secondary | ICD-10-CM | POA: Diagnosis not present

## 2023-04-15 ENCOUNTER — Ambulatory Visit
Admission: RE | Admit: 2023-04-15 | Discharge: 2023-04-15 | Disposition: A | Discharge status: Home / Self Care | Payer: PPO | Source: Ambulatory Visit | Attending: Family Medicine | Admitting: Family Medicine

## 2023-04-15 DIAGNOSIS — Z1231 Encounter for screening mammogram for malignant neoplasm of breast: Secondary | ICD-10-CM

## 2023-06-08 ENCOUNTER — Other Ambulatory Visit: Payer: Self-pay | Admitting: Family Medicine

## 2023-06-08 DIAGNOSIS — M48062 Spinal stenosis, lumbar region with neurogenic claudication: Secondary | ICD-10-CM

## 2023-06-11 ENCOUNTER — Ambulatory Visit
Admission: RE | Admit: 2023-06-11 | Discharge: 2023-06-11 | Disposition: A | Discharge status: Home / Self Care | Payer: PPO | Source: Ambulatory Visit | Attending: Family Medicine | Admitting: Family Medicine

## 2023-06-11 DIAGNOSIS — M48062 Spinal stenosis, lumbar region with neurogenic claudication: Secondary | ICD-10-CM

## 2023-07-20 DIAGNOSIS — Z6831 Body mass index (BMI) 31.0-31.9, adult: Secondary | ICD-10-CM | POA: Diagnosis not present

## 2023-07-20 DIAGNOSIS — M48062 Spinal stenosis, lumbar region with neurogenic claudication: Secondary | ICD-10-CM | POA: Diagnosis not present

## 2023-07-20 DIAGNOSIS — M4316 Spondylolisthesis, lumbar region: Secondary | ICD-10-CM | POA: Diagnosis not present

## 2023-08-24 DIAGNOSIS — I10 Essential (primary) hypertension: Secondary | ICD-10-CM | POA: Diagnosis not present

## 2023-08-24 DIAGNOSIS — E039 Hypothyroidism, unspecified: Secondary | ICD-10-CM | POA: Diagnosis not present

## 2023-08-24 DIAGNOSIS — E559 Vitamin D deficiency, unspecified: Secondary | ICD-10-CM | POA: Diagnosis not present

## 2023-08-24 DIAGNOSIS — E782 Mixed hyperlipidemia: Secondary | ICD-10-CM | POA: Diagnosis not present

## 2023-08-24 DIAGNOSIS — R7303 Prediabetes: Secondary | ICD-10-CM | POA: Diagnosis not present

## 2023-08-26 DIAGNOSIS — E039 Hypothyroidism, unspecified: Secondary | ICD-10-CM | POA: Diagnosis not present

## 2023-08-26 DIAGNOSIS — E782 Mixed hyperlipidemia: Secondary | ICD-10-CM | POA: Diagnosis not present

## 2023-08-26 DIAGNOSIS — M8580 Other specified disorders of bone density and structure, unspecified site: Secondary | ICD-10-CM | POA: Diagnosis not present

## 2023-08-26 DIAGNOSIS — E559 Vitamin D deficiency, unspecified: Secondary | ICD-10-CM | POA: Diagnosis not present

## 2023-08-26 DIAGNOSIS — M545 Low back pain, unspecified: Secondary | ICD-10-CM | POA: Diagnosis not present

## 2023-08-26 DIAGNOSIS — K219 Gastro-esophageal reflux disease without esophagitis: Secondary | ICD-10-CM | POA: Diagnosis not present

## 2023-08-26 DIAGNOSIS — I10 Essential (primary) hypertension: Secondary | ICD-10-CM | POA: Diagnosis not present

## 2023-08-26 DIAGNOSIS — R7303 Prediabetes: Secondary | ICD-10-CM | POA: Diagnosis not present

## 2023-08-26 DIAGNOSIS — K760 Fatty (change of) liver, not elsewhere classified: Secondary | ICD-10-CM | POA: Diagnosis not present

## 2023-08-28 ENCOUNTER — Other Ambulatory Visit: Payer: Self-pay | Admitting: Neurosurgery

## 2023-09-04 ENCOUNTER — Encounter (HOSPITAL_COMMUNITY): Payer: Self-pay

## 2023-09-04 NOTE — Progress Notes (Signed)
 Surgical Instructions   Your procedure is scheduled on Wednesday, May 21st, 2025. Report to [facility] Main Entrance "A" at 6:30 A.M., then check in with the Admitting office. Any questions or running late day of surgery: call [phone]  Questions prior to your surgery date: call [phone], Monday-Friday, 8am-4pm. If you experience any cold or flu symptoms such as cough, fever, chills, shortness of breath, etc. between now and your scheduled surgery, please notify us  at the above number.     Remember:  Do not eat or drink after midnight the night before your surgery     Take these medicines the morning of surgery with A SIP OF WATER: Amlodipine  (Norvasc ) Levothyroxine  (Synthroid ) Rosuvastatin (Crestor)   May take these medicines IF NEEDED: Pantoprazole (Protonix)    One week prior to surgery, STOP taking any Aspirin (unless otherwise instructed by your surgeon) Aleve, Naproxen, Ibuprofen, Motrin, Advil, Goody's, BC's, all herbal medications, fish oil, and non-prescription vitamins.                     Do NOT Smoke (Tobacco/Vaping) for 24 hours prior to your procedure.  If you use a CPAP at night, you may bring your mask/headgear for your overnight stay.   You will be asked to remove any contacts, glasses, piercing's, hearing aid's, dentures/partials prior to surgery. Please bring cases for these items if needed.    Patients discharged the day of surgery will not be allowed to drive home, and someone needs to stay with them for 24 hours.  SURGICAL WAITING ROOM VISITATION Patients may have no more than 2 support people in the waiting area - these visitors may rotate.   Pre-op nurse will coordinate an appropriate time for 1 ADULT support person, who may not rotate, to accompany patient in pre-op.  Children under the age of 65 must have an adult with them who is not the patient and must remain in the main waiting area with an adult.  If the patient needs to stay at the  hospital during part of their recovery, the visitor guidelines for inpatient rooms apply.  Please refer to the [facility] website for the visitor guidelines for any additional information.   If you received a COVID test during your pre-op visit  it is requested that you wear a mask when out in public, stay away from anyone that may not be feeling well and notify your surgeon if you develop symptoms. If you have been in contact with anyone that has tested positive in the last 10 days please notify you surgeon.      Pre-operative 5 CHG Bathing Instructions   You can play a key role in reducing the risk of infection after surgery. Your skin needs to be as free of germs as possible. You can reduce the number of germs on your skin by washing with CHG (chlorhexidine gluconate) soap before surgery. CHG is an antiseptic soap that kills germs and continues to kill germs even after washing.   DO NOT use if you have an allergy to chlorhexidine/CHG or antibacterial soaps. If your skin becomes reddened or irritated, stop using the CHG and notify one of our RNs at [phone].   Please shower with the CHG soap starting 4 days before surgery using the following schedule:     Please keep in mind the following:  DO NOT shave, including legs and underarms, starting the day of your first shower.   You may shave your face at any point  before/day of surgery.  Place clean sheets on your bed the day you start using CHG soap. Use a clean washcloth (not used since being washed) for each shower. DO NOT sleep with pets once you start using the CHG.   CHG Shower Instructions:  Wash your face and private area with normal soap. If you choose to wash your hair, wash first with your normal shampoo.  After you use shampoo/soap, rinse your hair and body thoroughly to remove shampoo/soap residue.  Turn the water OFF and apply about 3 tablespoons (45 ml) of CHG soap to a CLEAN washcloth.  Apply CHG soap ONLY FROM YOUR  NECK DOWN TO YOUR TOES (washing for 3-5 minutes)  DO NOT use CHG soap on face, private areas, open wounds, or sores.  Pay special attention to the area where your surgery is being performed.  If you are having back surgery, having someone wash your back for you may be helpful. Wait 2 minutes after CHG soap is applied, then you may rinse off the CHG soap.  Pat dry with a clean towel  Put on clean clothes/pajamas   If you choose to wear lotion, please use ONLY the CHG-compatible lotions that are listed below.  Additional instructions for the day of surgery: DO NOT APPLY any lotions, deodorants, cologne, or perfumes.   Do not bring valuables to the hospital. [facility] is not responsible for any belongings/valuables. Do not wear nail polish, gel polish, artificial nails, or any other type of covering on natural nails (fingers and toes) Do not wear jewelry or makeup Put on clean/comfortable clothes.  Please brush your teeth.  Ask your nurse before applying any prescription medications to the skin.     CHG Compatible Lotions   Aveeno Moisturizing lotion  Cetaphil Moisturizing Cream  Cetaphil Moisturizing Lotion  Clairol Herbal Essence Moisturizing Lotion, Dry Skin  Clairol Herbal Essence Moisturizing Lotion, Extra Dry Skin  Clairol Herbal Essence Moisturizing Lotion, Normal Skin  Curel Age Defying Therapeutic Moisturizing Lotion with Alpha Hydroxy  Curel Extreme Care Body Lotion  Curel Soothing Hands Moisturizing Hand Lotion  Curel Therapeutic Moisturizing Cream, Fragrance-Free  Curel Therapeutic Moisturizing Lotion, Fragrance-Free  Curel Therapeutic Moisturizing Lotion, Original Formula  Eucerin Daily Replenishing Lotion  Eucerin Dry Skin Therapy Plus Alpha Hydroxy Crme  Eucerin Dry Skin Therapy Plus Alpha Hydroxy Lotion  Eucerin Original Crme  Eucerin Original Lotion  Eucerin Plus Crme Eucerin Plus Lotion  Eucerin TriLipid Replenishing Lotion  Keri Anti-Bacterial Hand  Lotion  Keri Deep Conditioning Original Lotion Dry Skin Formula Softly Scented  Keri Deep Conditioning Original Lotion, Fragrance Free Sensitive Skin Formula  Keri Lotion Fast Absorbing Fragrance Free Sensitive Skin Formula  Keri Lotion Fast Absorbing Softly Scented Dry Skin Formula  Keri Original Lotion  Keri Skin Renewal Lotion Keri Silky Smooth Lotion  Keri Silky Smooth Sensitive Skin Lotion  Nivea Body Creamy Conditioning Oil  Nivea Body Extra Enriched Lotion  Nivea Body Original Lotion  Nivea Body Sheer Moisturizing Lotion Nivea Crme  Nivea Skin Firming Lotion  NutraDerm 30 Skin Lotion  NutraDerm Skin Lotion  NutraDerm Therapeutic Skin Cream  NutraDerm Therapeutic Skin Lotion  ProShield Protective Hand Cream  Provon moisturizing lotion  Please read over the following fact sheets that you were given.

## 2023-09-07 ENCOUNTER — Encounter (HOSPITAL_COMMUNITY): Payer: Self-pay

## 2023-09-07 ENCOUNTER — Other Ambulatory Visit: Payer: Self-pay

## 2023-09-07 ENCOUNTER — Encounter (HOSPITAL_COMMUNITY)
Admission: RE | Admit: 2023-09-07 | Discharge: 2023-09-07 | Disposition: A | Discharge status: Home / Self Care | Source: Ambulatory Visit | Attending: Neurosurgery | Admitting: Neurosurgery

## 2023-09-07 VITALS — BP 149/75 | HR 83 | Temp 98.2°F | Resp 18 | Ht 63.0 in | Wt 175.8 lb

## 2023-09-07 DIAGNOSIS — Z01818 Encounter for other preprocedural examination: Secondary | ICD-10-CM | POA: Diagnosis not present

## 2023-09-07 HISTORY — DX: Hyperlipidemia, unspecified: E78.5

## 2023-09-07 HISTORY — DX: Unspecified osteoarthritis, unspecified site: M19.90

## 2023-09-07 HISTORY — DX: Essential (primary) hypertension: I10

## 2023-09-07 HISTORY — DX: Other specified disorders of bone density and structure, unspecified site: M85.80

## 2023-09-07 HISTORY — DX: Prediabetes: R73.03

## 2023-09-07 HISTORY — DX: Hypothyroidism, unspecified: E03.9

## 2023-09-07 HISTORY — DX: Insomnia, unspecified: G47.00

## 2023-09-07 HISTORY — DX: Fatty (change of) liver, not elsewhere classified: K76.0

## 2023-09-07 HISTORY — DX: Gastro-esophageal reflux disease without esophagitis: K21.9

## 2023-09-07 LAB — TYPE AND SCREEN
ABO/RH(D): O POS
Antibody Screen: NEGATIVE

## 2023-09-07 LAB — SURGICAL PCR SCREEN
MRSA, PCR: NEGATIVE
Staphylococcus aureus: POSITIVE — AB

## 2023-09-07 NOTE — Progress Notes (Signed)
 Interpreter - Duncan Gibson PCP - Benedetto Brady - new; last one L. Norvel Beer retired Development worker, international aid - Denies  PPM/ICD - Denies Device Orders - n/a Rep Notified - n/a  Chest x-ray - n/a EKG - 09-07-23 Stress Test - Denies ECHO - Denies Cardiac Cath - Denies  Sleep Study - Denies CPAP -   Fasting Blood Sugar - Denies Checks Blood Sugar _____ times a day  Last dose of GLP1 agonist-  Denies GLP1 instructions:   Blood Thinner Instructions:Denies Aspirin Instructions:Denies  ERAS Protcol - NPO PRE-SURGERY Ensure or G2- none  COVID TEST- N/A   Anesthesia review: Yes, HTN w/new EKG   Patient denies shortness of breath, fever, cough and chest pain at PAT appointment. Patient denies any respiratory issues at this time.    All instructions explained to the patient, with a verbal understanding of the material. Patient agrees to go over the instructions while at home for a better understanding. Patient also instructed to self quarantine after being tested for COVID-19. The opportunity to ask questions was provided.

## 2023-09-11 DIAGNOSIS — M48062 Spinal stenosis, lumbar region with neurogenic claudication: Secondary | ICD-10-CM | POA: Diagnosis not present

## 2023-09-15 NOTE — Progress Notes (Signed)
 Spoke with the pt, she understands and fluent in [place].  She is aware of the time change. She will arrive tom at 1045. NPO post midnight.

## 2023-09-16 ENCOUNTER — Other Ambulatory Visit: Payer: Self-pay

## 2023-09-16 ENCOUNTER — Ambulatory Visit (HOSPITAL_BASED_OUTPATIENT_CLINIC_OR_DEPARTMENT_OTHER)

## 2023-09-16 ENCOUNTER — Ambulatory Visit (HOSPITAL_COMMUNITY)
Admission: RE | Admit: 2023-09-16 | Discharge: 2023-09-17 | Disposition: A | Discharge status: Home / Self Care | Attending: Neurosurgery | Admitting: Neurosurgery

## 2023-09-16 ENCOUNTER — Encounter (HOSPITAL_COMMUNITY): Payer: Self-pay | Admitting: Neurosurgery

## 2023-09-16 ENCOUNTER — Ambulatory Visit (HOSPITAL_COMMUNITY): Payer: Self-pay | Admitting: Physician Assistant

## 2023-09-16 ENCOUNTER — Encounter (HOSPITAL_COMMUNITY)
Admission: RE | Disposition: A | Discharge status: Home / Self Care | Payer: Self-pay | Source: Home / Self Care | Attending: Neurosurgery

## 2023-09-16 ENCOUNTER — Ambulatory Visit (HOSPITAL_COMMUNITY)

## 2023-09-16 DIAGNOSIS — R269 Unspecified abnormalities of gait and mobility: Secondary | ICD-10-CM | POA: Diagnosis not present

## 2023-09-16 DIAGNOSIS — I10 Essential (primary) hypertension: Secondary | ICD-10-CM | POA: Diagnosis not present

## 2023-09-16 DIAGNOSIS — K219 Gastro-esophageal reflux disease without esophagitis: Secondary | ICD-10-CM | POA: Insufficient documentation

## 2023-09-16 DIAGNOSIS — Z8711 Personal history of peptic ulcer disease: Secondary | ICD-10-CM | POA: Diagnosis not present

## 2023-09-16 DIAGNOSIS — M48062 Spinal stenosis, lumbar region with neurogenic claudication: Secondary | ICD-10-CM | POA: Diagnosis not present

## 2023-09-16 DIAGNOSIS — E785 Hyperlipidemia, unspecified: Secondary | ICD-10-CM | POA: Insufficient documentation

## 2023-09-16 DIAGNOSIS — E039 Hypothyroidism, unspecified: Secondary | ICD-10-CM | POA: Insufficient documentation

## 2023-09-16 DIAGNOSIS — M4316 Spondylolisthesis, lumbar region: Secondary | ICD-10-CM | POA: Diagnosis not present

## 2023-09-16 DIAGNOSIS — Z981 Arthrodesis status: Secondary | ICD-10-CM | POA: Diagnosis not present

## 2023-09-16 LAB — ABO/RH: ABO/RH(D): O POS

## 2023-09-16 LAB — GLUCOSE, CAPILLARY: Glucose-Capillary: 133 mg/dL — ABNORMAL HIGH (ref 70–99)

## 2023-09-16 SURGERY — POSTERIOR LUMBAR FUSION 1 LEVEL
Anesthesia: General | Site: Spine Lumbar

## 2023-09-16 MED ORDER — CALCIUM CARBONATE 1250 (500 CA) MG PO TABS
1.0000 | ORAL_TABLET | Freq: Every day | ORAL | Status: DC
  Administered 2023-09-17: 1250 mg via ORAL
  Filled 2023-09-16: qty 1

## 2023-09-16 MED ORDER — LIDOCAINE 2% (20 MG/ML) 5 ML SYRINGE
INTRAMUSCULAR | Status: DC | PRN
  Administered 2023-09-16: 40 mg via INTRAVENOUS

## 2023-09-16 MED ORDER — MENTHOL 3 MG MT LOZG: 1.0000 | LOZENGE | OROMUCOSAL | Status: DC | PRN

## 2023-09-16 MED ORDER — FENTANYL CITRATE (PF) 250 MCG/5ML IJ SOLN
INTRAMUSCULAR | Status: AC
  Filled 2023-09-16: qty 5

## 2023-09-16 MED ORDER — ALUM & MAG HYDROXIDE-SIMETH 200-200-20 MG/5ML PO SUSP: 30.0000 mL | Freq: Four times a day (QID) | ORAL | Status: DC | PRN

## 2023-09-16 MED ORDER — ROCURONIUM BROMIDE 10 MG/ML (PF) SYRINGE
PREFILLED_SYRINGE | INTRAVENOUS | Status: DC | PRN
  Administered 2023-09-16: 60 mg via INTRAVENOUS

## 2023-09-16 MED ORDER — PANTOPRAZOLE SODIUM 40 MG PO TBEC: 40.0000 mg | DELAYED_RELEASE_TABLET | Freq: Every day | ORAL | Status: DC | PRN

## 2023-09-16 MED ORDER — LIDOCAINE-EPINEPHRINE 0.5 %-1:200000 IJ SOLN
INTRAMUSCULAR | Status: DC | PRN
Start: 2023-09-16 — End: 2023-09-16
  Administered 2023-09-16: 20 mL

## 2023-09-16 MED ORDER — PROPOFOL 10 MG/ML IV BOLUS
INTRAVENOUS | Status: DC | PRN
  Administered 2023-09-16: 110 mg via INTRAVENOUS

## 2023-09-16 MED ORDER — HYDROMORPHONE HCL 1 MG/ML IJ SOLN
0.2500 mg | INTRAMUSCULAR | Status: DC | PRN
  Administered 2023-09-16: 0.5 mg via INTRAVENOUS

## 2023-09-16 MED ORDER — ONDANSETRON HCL 4 MG PO TABS: 4.0000 mg | ORAL_TABLET | Freq: Four times a day (QID) | ORAL | Status: DC | PRN

## 2023-09-16 MED ORDER — ACETAMINOPHEN 10 MG/ML IV SOLN
INTRAVENOUS | Status: AC
  Filled 2023-09-16: qty 100

## 2023-09-16 MED ORDER — THROMBIN 20000 UNITS EX SOLR: CUTANEOUS | Status: DC | PRN

## 2023-09-16 MED ORDER — ORAL CARE MOUTH RINSE: 15.0000 mL | Freq: Once | OROMUCOSAL | Status: AC

## 2023-09-16 MED ORDER — ACETAMINOPHEN 10 MG/ML IV SOLN
INTRAVENOUS | Status: DC | PRN
  Administered 2023-09-16: 1000 mg via INTRAVENOUS

## 2023-09-16 MED ORDER — DROPERIDOL 2.5 MG/ML IJ SOLN: 0.6250 mg | Freq: Once | INTRAMUSCULAR | Status: DC | PRN

## 2023-09-16 MED ORDER — DIAZEPAM 5 MG PO TABS: 5.0000 mg | ORAL_TABLET | Freq: Four times a day (QID) | ORAL | Status: DC | PRN

## 2023-09-16 MED ORDER — BUPIVACAINE HCL (PF) 0.5 % IJ SOLN
INTRAMUSCULAR | Status: AC
  Filled 2023-09-16: qty 30

## 2023-09-16 MED ORDER — ACETAMINOPHEN 325 MG PO TABS: 650.0000 mg | ORAL_TABLET | ORAL | Status: DC | PRN

## 2023-09-16 MED ORDER — LEVOTHYROXINE SODIUM 25 MCG PO TABS
50.0000 ug | ORAL_TABLET | Freq: Every morning | ORAL | Status: DC
  Administered 2023-09-17: 50 ug via ORAL
  Filled 2023-09-16: qty 2

## 2023-09-16 MED ORDER — CHLORHEXIDINE GLUCONATE CLOTH 2 % EX PADS
6.0000 | MEDICATED_PAD | Freq: Once | CUTANEOUS | Status: DC
Start: 2023-09-16 — End: 2023-09-16

## 2023-09-16 MED ORDER — FENTANYL CITRATE (PF) 250 MCG/5ML IJ SOLN
INTRAMUSCULAR | Status: DC | PRN
  Administered 2023-09-16 (×2): 50 ug via INTRAVENOUS

## 2023-09-16 MED ORDER — BISACODYL 5 MG PO TBEC: 5.0000 mg | DELAYED_RELEASE_TABLET | Freq: Every day | ORAL | Status: DC | PRN

## 2023-09-16 MED ORDER — SENNA 8.6 MG PO TABS
1.0000 | ORAL_TABLET | Freq: Two times a day (BID) | ORAL | Status: DC
  Administered 2023-09-17: 8.6 mg via ORAL
  Filled 2023-09-16: qty 1

## 2023-09-16 MED ORDER — ACETAMINOPHEN 500 MG PO TABS: 1000.0000 mg | ORAL_TABLET | Freq: Once | ORAL | Status: DC

## 2023-09-16 MED ORDER — POTASSIUM CHLORIDE CRYS ER 10 MEQ PO TBCR
10.0000 meq | EXTENDED_RELEASE_TABLET | Freq: Once | ORAL | Status: AC
  Administered 2023-09-16: 10 meq via ORAL
  Filled 2023-09-16: qty 1

## 2023-09-16 MED ORDER — PROPOFOL 10 MG/ML IV BOLUS
INTRAVENOUS | Status: AC
  Filled 2023-09-16: qty 20

## 2023-09-16 MED ORDER — AMISULPRIDE (ANTIEMETIC) 5 MG/2ML IV SOLN
INTRAVENOUS | Status: AC
  Filled 2023-09-16: qty 4

## 2023-09-16 MED ORDER — 0.9 % SODIUM CHLORIDE (POUR BTL) OPTIME
TOPICAL | Status: DC | PRN
  Administered 2023-09-16: 1000 mL

## 2023-09-16 MED ORDER — ONDANSETRON HCL 4 MG/2ML IJ SOLN
INTRAMUSCULAR | Status: DC | PRN
  Administered 2023-09-16: 4 mg via INTRAVENOUS

## 2023-09-16 MED ORDER — AMLODIPINE BESYLATE 5 MG PO TABS
5.0000 mg | ORAL_TABLET | Freq: Every day | ORAL | Status: DC
  Administered 2023-09-17: 5 mg via ORAL
  Filled 2023-09-16: qty 1

## 2023-09-16 MED ORDER — PROPOFOL 1000 MG/100ML IV EMUL
INTRAVENOUS | Status: AC
  Filled 2023-09-16: qty 100

## 2023-09-16 MED ORDER — ACETAMINOPHEN 500 MG PO TABS
1000.0000 mg | ORAL_TABLET | Freq: Four times a day (QID) | ORAL | Status: DC
  Administered 2023-09-16 – 2023-09-17 (×2): 1000 mg via ORAL
  Filled 2023-09-16 (×2): qty 2

## 2023-09-16 MED ORDER — HEPARIN SODIUM (PORCINE) 5000 UNIT/ML IJ SOLN: 5000.0000 [IU] | Freq: Three times a day (TID) | INTRAMUSCULAR | Status: DC

## 2023-09-16 MED ORDER — POTASSIUM CHLORIDE IN NACL 20-0.9 MEQ/L-% IV SOLN
INTRAVENOUS | Status: DC
  Filled 2023-09-16: qty 1000

## 2023-09-16 MED ORDER — LIDOCAINE 2% (20 MG/ML) 5 ML SYRINGE
INTRAMUSCULAR | Status: AC
  Filled 2023-09-16: qty 10

## 2023-09-16 MED ORDER — LISINOPRIL-HYDROCHLOROTHIAZIDE 20-25 MG PO TABS
1.0000 | ORAL_TABLET | Freq: Every morning | ORAL | Status: DC
Start: 2023-09-17 — End: 2023-09-16

## 2023-09-16 MED ORDER — OXYCODONE HCL 5 MG PO TABS: 5.0000 mg | ORAL_TABLET | ORAL | Status: DC | PRN

## 2023-09-16 MED ORDER — ROSUVASTATIN CALCIUM 5 MG PO TABS
10.0000 mg | ORAL_TABLET | Freq: Every morning | ORAL | Status: DC
  Administered 2023-09-17: 10 mg via ORAL
  Filled 2023-09-16: qty 2

## 2023-09-16 MED ORDER — ZOLPIDEM TARTRATE 5 MG PO TABS: 5.0000 mg | ORAL_TABLET | Freq: Every evening | ORAL | Status: DC | PRN

## 2023-09-16 MED ORDER — MAGNESIUM CITRATE PO SOLN: 1.0000 | Freq: Once | ORAL | Status: DC | PRN

## 2023-09-16 MED ORDER — SENNOSIDES-DOCUSATE SODIUM 8.6-50 MG PO TABS: 1.0000 | ORAL_TABLET | Freq: Every evening | ORAL | Status: DC | PRN

## 2023-09-16 MED ORDER — DEXAMETHASONE SODIUM PHOSPHATE 10 MG/ML IJ SOLN
INTRAMUSCULAR | Status: AC
  Filled 2023-09-16: qty 1

## 2023-09-16 MED ORDER — SODIUM CHLORIDE 0.9 % IV SOLN
250.0000 mL | INTRAVENOUS | Status: DC
  Administered 2023-09-16: 250 mL via INTRAVENOUS

## 2023-09-16 MED ORDER — VITAMIN C 500 MG PO TABS
500.0000 mg | ORAL_TABLET | Freq: Every day | ORAL | Status: DC
Start: 2023-09-17 — End: 2023-09-17
  Administered 2023-09-17: 500 mg via ORAL
  Filled 2023-09-16: qty 1

## 2023-09-16 MED ORDER — ROCURONIUM BROMIDE 10 MG/ML (PF) SYRINGE
PREFILLED_SYRINGE | INTRAVENOUS | Status: AC
  Filled 2023-09-16: qty 20

## 2023-09-16 MED ORDER — AMISULPRIDE (ANTIEMETIC) 5 MG/2ML IV SOLN
10.0000 mg | Freq: Once | INTRAVENOUS | Status: AC
  Administered 2023-09-16: 10 mg via INTRAVENOUS

## 2023-09-16 MED ORDER — CEFAZOLIN SODIUM-DEXTROSE 2-4 GM/100ML-% IV SOLN
2.0000 g | INTRAVENOUS | Status: AC
  Administered 2023-09-16: 2 g via INTRAVENOUS
  Filled 2023-09-16: qty 100

## 2023-09-16 MED ORDER — ACETAMINOPHEN 650 MG RE SUPP: 650.0000 mg | RECTAL | Status: DC | PRN

## 2023-09-16 MED ORDER — ONDANSETRON HCL 4 MG/2ML IJ SOLN: 4.0000 mg | Freq: Four times a day (QID) | INTRAMUSCULAR | Status: DC | PRN

## 2023-09-16 MED ORDER — PHENOL 1.4 % MT LIQD: 1.0000 | OROMUCOSAL | Status: DC | PRN

## 2023-09-16 MED ORDER — CHLORHEXIDINE GLUCONATE CLOTH 2 % EX PADS: 6.0000 | MEDICATED_PAD | Freq: Once | CUTANEOUS | Status: DC

## 2023-09-16 MED ORDER — LIDOCAINE-EPINEPHRINE 0.5 %-1:200000 IJ SOLN
INTRAMUSCULAR | Status: AC
  Filled 2023-09-16: qty 50

## 2023-09-16 MED ORDER — DEXAMETHASONE SODIUM PHOSPHATE 10 MG/ML IJ SOLN
INTRAMUSCULAR | Status: DC | PRN
  Administered 2023-09-16: 5 mg via INTRAVENOUS

## 2023-09-16 MED ORDER — PHENYLEPHRINE 80 MCG/ML (10ML) SYRINGE FOR IV PUSH (FOR BLOOD PRESSURE SUPPORT)
PREFILLED_SYRINGE | INTRAVENOUS | Status: AC
  Filled 2023-09-16: qty 10

## 2023-09-16 MED ORDER — BUPIVACAINE HCL (PF) 0.5 % IJ SOLN
INTRAMUSCULAR | Status: DC | PRN
  Administered 2023-09-16: 30 mL

## 2023-09-16 MED ORDER — THROMBIN 20000 UNITS EX SOLR
CUTANEOUS | Status: AC
  Filled 2023-09-16: qty 20000

## 2023-09-16 MED ORDER — OXYCODONE HCL 5 MG PO TABS
10.0000 mg | ORAL_TABLET | ORAL | Status: DC | PRN
  Administered 2023-09-16 – 2023-09-17 (×4): 10 mg via ORAL
  Filled 2023-09-16 (×4): qty 2

## 2023-09-16 MED ORDER — HYDROMORPHONE HCL 1 MG/ML IJ SOLN
INTRAMUSCULAR | Status: AC
  Filled 2023-09-16: qty 1

## 2023-09-16 MED ORDER — LISINOPRIL 20 MG PO TABS
20.0000 mg | ORAL_TABLET | Freq: Every day | ORAL | Status: DC
  Administered 2023-09-17: 20 mg via ORAL
  Filled 2023-09-16: qty 1

## 2023-09-16 MED ORDER — SODIUM CHLORIDE 0.9% FLUSH: 3.0000 mL | INTRAVENOUS | Status: DC | PRN

## 2023-09-16 MED ORDER — SUGAMMADEX SODIUM 200 MG/2ML IV SOLN
INTRAVENOUS | Status: DC | PRN
  Administered 2023-09-16: 200 mg via INTRAVENOUS

## 2023-09-16 MED ORDER — HYDROCHLOROTHIAZIDE 25 MG PO TABS
25.0000 mg | ORAL_TABLET | Freq: Every day | ORAL | Status: DC
  Administered 2023-09-17: 25 mg via ORAL
  Filled 2023-09-16: qty 1

## 2023-09-16 MED ORDER — LACTATED RINGERS IV SOLN: INTRAVENOUS | Status: DC

## 2023-09-16 MED ORDER — PROPOFOL 500 MG/50ML IV EMUL
INTRAVENOUS | Status: DC | PRN
  Administered 2023-09-16: 100 ug/kg/min via INTRAVENOUS

## 2023-09-16 MED ORDER — CHLORHEXIDINE GLUCONATE 0.12 % MT SOLN
15.0000 mL | Freq: Once | OROMUCOSAL | Status: AC
  Administered 2023-09-16: 15 mL via OROMUCOSAL
  Filled 2023-09-16: qty 15

## 2023-09-16 MED ORDER — MORPHINE SULFATE (PF) 2 MG/ML IV SOLN: 2.0000 mg | INTRAVENOUS | Status: DC | PRN

## 2023-09-16 MED ORDER — NAPHAZOLINE-GLYCERIN 0.012-0.25 % OP SOLN: 1.0000 [drp] | Freq: Three times a day (TID) | OPHTHALMIC | Status: DC | PRN

## 2023-09-16 MED ORDER — SODIUM CHLORIDE 0.9% FLUSH: 3.0000 mL | Freq: Two times a day (BID) | INTRAVENOUS | Status: DC

## 2023-09-16 MED ORDER — PHENYLEPHRINE HCL-NACL 20-0.9 MG/250ML-% IV SOLN
INTRAVENOUS | Status: DC | PRN
  Administered 2023-09-16: 30 ug/min via INTRAVENOUS

## 2023-09-16 SURGICAL SUPPLY — 59 items
BAG COUNTER SPONGE SURGICOUNT (BAG) ×4 IMPLANT
BASKET BONE COLLECTION (BASKET) ×2 IMPLANT
BENZOIN TINCTURE PRP APPL 2/3 (GAUZE/BANDAGES/DRESSINGS) IMPLANT
BIT DRILL PLIF MAS DISP 5.5MM (DRILL) ×1 IMPLANT
BLADE BONE MILL MEDIUM (MISCELLANEOUS) ×2 IMPLANT
BLADE CLIPPER SURG (BLADE) IMPLANT
BUR MATCHSTICK NEURO 3.0 LAGG (BURR) ×2 IMPLANT
BUR PRECISION FLUTE 5.0 (BURR) IMPLANT
CANISTER SUCTION 3000ML PPV (SUCTIONS) ×4 IMPLANT
CAP RELINE MOD TULIP RMM (Cap) ×4 IMPLANT
CNTNR URN SCR LID CUP LEK RST (MISCELLANEOUS) ×2 IMPLANT
COVER BACK TABLE 60X90IN (DRAPES) ×2 IMPLANT
DERMABOND ADVANCED .7 DNX12 (GAUZE/BANDAGES/DRESSINGS) ×3 IMPLANT
DRAPE C-ARM 42X72 X-RAY (DRAPES) ×4 IMPLANT
DRAPE C-ARMOR (DRAPES) ×1 IMPLANT
DRAPE LAPAROTOMY 100X72X124 (DRAPES) ×4 IMPLANT
DRAPE MICROSCOPE SLANT 54X150 (MISCELLANEOUS) ×2 IMPLANT
DRAPE SURG 17X23 STRL (DRAPES) ×4 IMPLANT
DURAPREP 26ML APPLICATOR (WOUND CARE) ×4 IMPLANT
ELECTRODE REM PT RTRN 9FT ADLT (ELECTROSURGICAL) ×4 IMPLANT
GAUZE 4X4 16PLY ~~LOC~~+RFID DBL (SPONGE) IMPLANT
GAUZE SPONGE 4X4 12PLY STRL (GAUZE/BANDAGES/DRESSINGS) IMPLANT
GLOVE ECLIPSE 6.5 STRL STRAW (GLOVE) ×2 IMPLANT
GLOVE EXAM NITRILE XL STR (GLOVE) IMPLANT
GLOVE SURG LTX SZ6.5 (GLOVE) ×4 IMPLANT
GOWN STRL REUS W/ TWL LRG LVL3 (GOWN DISPOSABLE) ×8 IMPLANT
GOWN STRL REUS W/ TWL XL LVL3 (GOWN DISPOSABLE) IMPLANT
GOWN STRL REUS W/TWL 2XL LVL3 (GOWN DISPOSABLE) IMPLANT
KIT BASIN OR (CUSTOM PROCEDURE TRAY) ×4 IMPLANT
KIT POSITION SURG JACKSON T1 (MISCELLANEOUS) ×2 IMPLANT
KIT TURNOVER KIT B (KITS) ×4 IMPLANT
NDL HYPO 25X1 1.5 SAFETY (NEEDLE) ×2 IMPLANT
NDL SPNL 18GX3.5 QUINCKE PK (NEEDLE) IMPLANT
NEEDLE HYPO 25X1 1.5 SAFETY (NEEDLE) ×4 IMPLANT
NEEDLE SPNL 18GX3.5 QUINCKE PK (NEEDLE) IMPLANT
NS IRRIG 1000ML POUR BTL (IV SOLUTION) ×4 IMPLANT
PACK LAMINECTOMY NEURO (CUSTOM PROCEDURE TRAY) ×2 IMPLANT
PAD ARMBOARD POSITIONER FOAM (MISCELLANEOUS) ×10 IMPLANT
PATTIES SURGICAL .5 X.5 (GAUZE/BANDAGES/DRESSINGS) ×4 IMPLANT
PATTIES SURGICAL 1X1 (DISPOSABLE) ×4 IMPLANT
ROD RELINE COCR LORD 5X30 (Rod) ×2 IMPLANT
SCREW LOCK RSS 4.5/5.0MM (Screw) ×4 IMPLANT
SCREW SHANK RELINE MOD 6.5X30 (Screw) ×1 IMPLANT
SCREW SHANK RELINE MOD 6.5X35 (Screw) ×3 IMPLANT
SPACER HALF DOME 9X24X9.5 10D (Spacer) ×2 IMPLANT
SPIKE FLUID TRANSFER (MISCELLANEOUS) ×4 IMPLANT
SPONGE SURGIFOAM ABS GEL 100 (HEMOSTASIS) ×1 IMPLANT
SPONGE SURGIFOAM ABS GEL SZ50 (HEMOSTASIS) ×2 IMPLANT
SPONGE T-LAP 4X18 ~~LOC~~+RFID (SPONGE) IMPLANT
STRIP CLOSURE SKIN 1/2X4 (GAUZE/BANDAGES/DRESSINGS) IMPLANT
SUT PROLENE 6 0 BV (SUTURE) IMPLANT
SUT VIC AB 0 CT1 18XCR BRD8 (SUTURE) ×2 IMPLANT
SUT VIC AB 2-0 CT1 18 (SUTURE) ×2 IMPLANT
SUT VIC AB 3-0 SH 8-18 (SUTURE) ×2 IMPLANT
SYR 30ML SLIP (SYRINGE) ×4 IMPLANT
TOWEL GREEN STERILE (TOWEL DISPOSABLE) ×4 IMPLANT
TOWEL GREEN STERILE FF (TOWEL DISPOSABLE) ×4 IMPLANT
TRAY FOLEY MTR SLVR 16FR STAT (SET/KITS/TRAYS/PACK) ×2 IMPLANT
WATER STERILE IRR 1000ML POUR (IV SOLUTION) ×4 IMPLANT

## 2023-09-16 NOTE — Progress Notes (Signed)
 Spanish Interpreter-[employer]a Col [phone]   Pia Brew, RN

## 2023-09-16 NOTE — Progress Notes (Signed)
 Interpreter from [employer] will be leaving due to the surgery starting 4 hours late. She left the number to the company [phone] just in case the pt may need their services. The interpreter also stated that the pt does speak and understand english, she just has a very "thick" spanish accent.

## 2023-09-16 NOTE — Anesthesia Preprocedure Evaluation (Signed)
 Anesthesia Evaluation  Patient identified by MRN, date of birth, ID band Patient awake    Reviewed: Allergy & Precautions, NPO status , Patient's Chart, lab work & pertinent test results  Airway Mallampati: II  TM Distance: >3 FB Neck ROM: Full    Dental  (+) Partial Lower, Partial Upper, Dental Advisory Given, Missing   Pulmonary neg pulmonary ROS   Pulmonary exam normal breath sounds clear to auscultation       Cardiovascular hypertension, Pt. on medications negative cardio ROS Normal cardiovascular exam Rhythm:Regular Rate:Normal     Neuro/Psych negative neurological ROS     GI/Hepatic Neg liver ROS, PUD,GERD  Controlled,,  Endo/Other  Hypothyroidism    Renal/GU negative Renal ROS     Musculoskeletal  (+) Arthritis ,    Abdominal   Peds  Hematology negative hematology ROS (+)   Anesthesia Other Findings   Reproductive/Obstetrics                             Anesthesia Physical Anesthesia Plan  ASA: 2  Anesthesia Plan: General   Post-op Pain Management: Tylenol PO (pre-op)*   Induction: Intravenous  PONV Risk Score and Plan: 4 or greater and Ondansetron, Dexamethasone  and Treatment may vary due to age or medical condition  Airway Management Planned: Oral ETT  Additional Equipment:   Intra-op Plan:   Post-operative Plan: Extubation in OR  Informed Consent: I have reviewed the patients History and Physical, chart, labs and discussed the procedure including the risks, benefits and alternatives for the proposed anesthesia with the patient or authorized representative who has indicated his/her understanding and acceptance.     Dental advisory given  Plan Discussed with: CRNA  Anesthesia Plan Comments:        Anesthesia Quick Evaluation

## 2023-09-16 NOTE — H&P (Signed)
 BP (!) 150/64   Pulse (!) 57   Temp 98.2 F (36.8 C) (Oral)   Resp 18   Ht 5\' 3"  (1.6 m)   Wt 79.4 kg   SpO2 97%   BMI 31.00 kg/m   Taylor Richards comes in today for evaluation of pain that she has in the back and both lower extremities.  She has had injections, she has had physical therapy, she has tried braces for the lower extremities, and she has had this pain, for at the very least, 10 years.  Her son accompanies her and does help with her history.  She weighs 177 pounds.  Temperature is 98.7, blood pressure 130/85, pulse is 78.  Pain is 8/10.  She uses a cane to walk, and has used a cane for approximately 3 years.  She has had no bowel or bladder dysfunction.  Pain is worse the longer she walks or the longer she stands.  Current medications are Vitamin D , Amlodipine , Calcium, Levothyroxine , Lisinopril , Hydrochlorothiazide , Potassium Chloride , Celecoxib, Pantoprazole, Vitamin E, Centrum Silver.   History includes hypertension, hypothyroidism, and hyperlipidemia.  She does not smoke.  She does not use alcohol.  She does not use illicit drugs.  She is divorced, has 2 children, and is currently retired.  Etodolac causes her stomach to be upset.  She actually has no allergies.  She is right handed.  She does live alone.  She does have children.   Her biggest complaint is lower extremity pain.  Says the pain came on by itself.  It has increased gradually to 8/10.  She describes it is aching, burning, deep.  She can stand or walk for a minute without pain.  She does have lower extremity weakness.  She does have some problems controlling bladder.  The bladder problem is not new.  She reports numbness, tingling, problems with walking, joint pain, joint stiffness, weakness in the lower back.  PHYSICAL EXAMINATION: On exam, she is alert, oriented x4. She answers all questions appropriately.  Memory, language, attention span, and fund of knowledge are normal. Speech is clear, it is also fluent.  She has  normal strength on manual exam for a 78 year old woman.  Her balance may be slightly off, but her Romberg was passed without difficulty.  Tandem walking was done with great difficulty.  She can stand on her toes.  She can stand on her heels.  IMAGING: MRI of the lumbar spine shows severe stenosis at L4-5, where she also has a listhesis anterior.  She has some milder stenosis at 3-4.  She has bone spurs at 1-2, 2-3.  The conus is normal. The cauda equina is normal.  PLAN: I explained to Taylor Richards and her son that the operation would entail a posterior lumbar interbody arthrodesis at L4-5, possibly a laminectomy and simple decompression at 3-4.  She is severely compromised at 4-5, and I think, therefore, that she would be significantly improved with a decompression there.  The screws and rods are not done to ameliorate the pain but to stabilize the spine after moving enough bone and ligament to decompress the neural elements.  I told her I would expect her to want to leave the next day, as most patients do, but if she needed to leave because she was in too much pain or discomfort, or some other problem, she would leave when she was ready.  We would try for home health physical therapy as she does live alone.  If inpatient rehab is mentioned, that  might be a better option for the family

## 2023-09-16 NOTE — Anesthesia Procedure Notes (Signed)
 Procedure Name: Intubation Date/Time: 09/16/2023 4:19 PM  Performed by: Claud Crumb, CRNAPre-anesthesia Checklist: Patient identified, Emergency Drugs available, Suction available and Patient being monitored Patient Re-evaluated:Patient Re-evaluated prior to induction Oxygen Delivery Method: Circle System Utilized Preoxygenation: Pre-oxygenation with 100% oxygen Induction Type: IV induction Ventilation: Mask ventilation without difficulty and Oral airway inserted - appropriate to patient size Laryngoscope Size: Mac and 3 Grade View: Grade II Tube type: Oral Tube size: 7.0 mm Number of attempts: 1 Airway Equipment and Method: Stylet and Oral airway Placement Confirmation: ETT inserted through vocal cords under direct vision, positive ETCO2 and breath sounds checked- equal and bilateral Secured at: 21 cm Tube secured with: Tape Dental Injury: Teeth and Oropharynx as per pre-operative assessment  Comments: Jiyoung, SRNA for induction/intubation.

## 2023-09-16 NOTE — Op Note (Signed)
 09/16/2023  8:55 PM  PATIENT:  Taylor Richards  78 y.o. female With lumbar spondylolisthesis at L4/5 causing neurogenic claudication. She has agreed to operative decompression and arthrodesis PRE-OPERATIVE DIAGNOSIS:  Lumbar stenosis with neurogenic claudication, spondylolisthesis L4/5 POST-OPERATIVE DIAGNOSIS:  Lumbar stenosis with neurogenic claudication Lumbar spondylolisthesis L4/5 PROCEDURE:  Procedure(s): LUMBAR FOUR-FIVE POSTERIOR LUMBAR INTERBODY FUSION, half dome X with autograft morsels also packed in the disc space Lumbar laminectomy in excess of the needed bony removal to perform a plif Non segmental posterior instrumentation Nuvasive hardware SURGEON:  Surgeon(s): Audie Bleacher, MD  ASSISTANTS:none  ANESTHESIA:   general  EBL:  Total I/O In: 800 [I.V.:800] Out: 50 [Urine:50]  BLOOD ADMINISTERED:none  CELL SAVER GIVEN:not used  COUNT:per nursing  DRAINS: Urinary Catheter (Foley)   SPECIMEN:  No Specimen  DICTATION: [name] is a 78 y.o. female whom was taken to the operating room intubated, and placed under a general anesthetic without difficulty. A foley catheter was placed under sterile conditions. She was positioned prone on a Jackson table with all pressure points properly padded.  His lumbar region was prepped and draped in a sterile manner. I infiltrated 20cc's 1/2%lidocaine/1:2000,000 strength epinephrine into the planned incision. I opened the skin with a 10 blade and took the incision down to the thoracolumbar fascia. I exposed the lamina of L3,4, and 5 in a subperiosteal fashion bilaterally. I confirmed my location with an intraoperative xray.  I placed self retaining retractors and started the decompression.  I decompressed the spinal canal via a laminectomy of L4 well beyond the needed exposure for a plif. I used the drill, and Kerrison punches to decompress the canal, and the lateral recesses along with the neural foramina. The L4 and L5 roots were  well decompressed. PLIF's were performed at L4/5 in the same fashion. I opened the disc space with a 15 blade then used a variety of instruments to remove the disc and prepare the space for the arthrodesis. I used curettes, rongeurs, punches, shavers for the disc space, and rasps in the discetomy. I measured the disc space and placed 2 expandable titanium cages packed with autograft morsels (Half Dome X) into the disc space(s).   I placed pedicle screws at L4 and L5, using fluoroscopic guidance. I drilled a pilot hole, then cannulated the pedicle with a drill at each site. I then tapped each pedicle, assessing each site for pedicle violations. No cutouts were appreciated. Screws (Nuvasive mas plif) were then placed at each site without difficulty. I attached rods and locking caps with the appropriate tools. The locking caps were secured with torque limited screwdrivers. Final films were performed and the final construct appeared to be in good position.  I closed the wound in a layered fashion. I approximated the thoracolumbar fascia, subcutaneous, and subcuticular planes with vicryl sutures. I used Dermabond, and an occlusive bandage for a sterile dressing.     PLAN OF CARE: Admit to inpatient   PATIENT DISPOSITION:  PACU - hemodynamically stable.   Delay start of Pharmacological VTE agent (>24hrs) due to surgical blood loss or risk of bleeding:  yes

## 2023-09-17 DIAGNOSIS — M48062 Spinal stenosis, lumbar region with neurogenic claudication: Secondary | ICD-10-CM | POA: Diagnosis not present

## 2023-09-17 LAB — CBC
HCT: 37.1 % (ref 36.0–46.0)
Hemoglobin: 12.3 g/dL (ref 12.0–15.0)
MCH: 31.4 pg (ref 26.0–34.0)
MCHC: 33.2 g/dL (ref 30.0–36.0)
MCV: 94.6 fL (ref 80.0–100.0)
Platelets: 261 10*3/uL (ref 150–400)
RBC: 3.92 MIL/uL (ref 3.87–5.11)
RDW: 12.6 % (ref 11.5–15.5)
WBC: 13.4 10*3/uL — ABNORMAL HIGH (ref 4.0–10.5)
nRBC: 0 % (ref 0.0–0.2)

## 2023-09-17 LAB — CREATININE, SERUM
Creatinine, Ser: 0.68 mg/dL (ref 0.44–1.00)
GFR, Estimated: >60 mL/min (ref >60)

## 2023-09-17 MED ORDER — TIZANIDINE HCL 4 MG PO TABS: 4.0000 mg | ORAL_TABLET | Freq: Four times a day (QID) | ORAL | 0 refills | Status: AC | PRN

## 2023-09-17 MED ORDER — OXYCODONE HCL 5 MG PO TABS
5.0000 mg | ORAL_TABLET | Freq: Four times a day (QID) | ORAL | 0 refills | Status: AC | PRN
Start: 2023-09-17 — End: ?

## 2023-09-17 MED ORDER — HEPARIN SODIUM (PORCINE) 5000 UNIT/ML IJ SOLN: 5000.0000 [IU] | Freq: Three times a day (TID) | INTRAMUSCULAR | Status: DC

## 2023-09-17 NOTE — Discharge Instructions (Signed)

## 2023-09-17 NOTE — TOC Transition Note (Signed)
 Transition of Care [facility]) - Discharge Note   Patient Details  Name: Taylor Richards MRN: [id number] Date of Birth: December 06, 1945  Transition of [facility]) CM/SW Contact:  Jonathan Neighbor, RN Phone Number: 09/17/2023, 11:44 AM   Clinical Narrative:    Pt is discharging to her sons home at: [address] in [place] [address] Home health arranged with [facility]. Information on the AVS. Lennart Quitter is aware she will be at her sons address.  Son to transport home.   Final next level of care: Home w Home Health Services Barriers to Discharge: No Barriers Identified   Patient Goals and CMS Choice   CMS Medicare.gov Compare Post Acute Care list provided to:: Patient Choice offered to / list presented to : Patient, Adult Children      Discharge Placement                       Discharge Plan and Services Additional resources added to the After Visit Summary for                            [facility] Arranged: OT, PT [facility] Agency: [facility] Date [facility] Agency Contacted: 09/17/23   Representative spoke with at [facility] Agency: Amy  Social Drivers of Health (SDOH) Interventions SDOH Screenings   Tobacco Use: Low Risk  (09/16/2023)     Readmission Risk Interventions     No data to display

## 2023-09-17 NOTE — Discharge Summary (Signed)
 Physician Discharge Summary  Patient ID: Taylor Richards MRN: [id number] DOB/AGE: 31-Jul-1945 78 y.o.  Admit date: 09/16/2023 Discharge date: 09/17/2023  Admission Diagnoses:Lumbar spondylolisthesis L4/5 Spinal stenosis with neurogenic claudication L4/5  Discharge Diagnoses:  Principal Problem:   Spondylolisthesis at L4-L5 level   Discharged Condition: good  Hospital Course: Taylor Richards was admitted and taken to the operating room for an uncomplicated PLIF at L4/5. Post op she is ambulating, voiding, and tolerating a regular diet. Her wound is clean, dry, and without signs of infection.   Treatments: surgery: L4/5 PLIF, nuvasive hardware, expandable Half Dome X cages.   Discharge Exam: Blood pressure 131/70, pulse 63, temperature 98.6 F (37 C), temperature source Oral, resp. rate 16, height 5\' 3"  (1.6 m), weight 79.4 kg, SpO2 97%. General appearance: alert, cooperative, appears stated age, and no distress  Disposition: Discharge disposition: 01-Home or Self Care      Lumbar stenosis with neurogenic claudication  Allergies as of 09/17/2023   No Known Allergies      Medication List     TAKE these medications    amLODipine  5 MG tablet Commonly known as: NORVASC  Take 1 tablet (5 mg total) by mouth daily.   BENGAY EX Apply 1 Application topically 3 (three) times daily as needed (pain.).   CALCIUM PO Take 1 tablet by mouth daily.   FISH OIL PO Take 1 capsule by mouth daily.   levothyroxine  50 MCG tablet Commonly known as: SYNTHROID  Take 1 tablet (50 mcg total) by mouth in the morning an emtpy stomach   lisinopril -hydrochlorothiazide  20-25 MG tablet Commonly known as: ZESTORETIC  Take 1 tablet by mouth in the morning.   Lubricant Eye Drops 0.4-0.3 % Soln Generic drug: Polyethyl Glycol-Propyl Glycol Place 1-2 drops into both eyes 3 (three) times daily as needed (dry/irritated eyes.).   multivitamin with minerals Tabs tablet Take 1 tablet by mouth daily.    oxyCODONE 5 MG immediate release tablet Commonly known as: Oxy IR/ROXICODONE Take 1 tablet (5 mg total) by mouth every 6 (six) hours as needed for moderate pain (pain score 4-6).   pantoprazole 40 MG tablet Commonly known as: PROTONIX Take 40 mg by mouth daily as needed (indigestion/heartburn).   potassium chloride  10 MEQ tablet Commonly known as: KLOR-CON  TAKE 1 TABLET BY MOUTH ONCE A DAY   rosuvastatin 10 MG tablet Commonly known as: CRESTOR Take 10 mg by mouth in the morning.   tiZANidine 4 MG tablet Commonly known as: ZANAFLEX Take 1 tablet (4 mg total) by mouth every 6 (six) hours as needed for muscle spasms.   VITAMIN C PO Take 1 tablet by mouth daily.   Vitamin D  (Ergocalciferol ) 1.25 MG (50000 UNIT) Caps capsule Commonly known as: DRISDOL  Take 1 capsule (50,000 Units total) by mouth once a week. What changed: when to take this        Follow-up Information     Audie Bleacher, MD Follow up.   Specialty: Neurosurgery Why: keep your scheduled appointment Contact information: [address] [place] [address] [phone]                 Signed: Audie Bleacher 09/17/2023, 10:26 AM

## 2023-09-17 NOTE — Anesthesia Postprocedure Evaluation (Signed)
 Anesthesia Post Note  Patient: Taylor Richards  Procedure(s) Performed: LUMBAR FOUR-FIVE POSTERIOR LUMBAR INTERBODY FUSION (Spine Lumbar)     Patient location during evaluation: PACU Anesthesia Type: General Level of consciousness: awake Pain management: pain level controlled Vital Signs Assessment: post-procedure vital signs reviewed and stable Respiratory status: spontaneous breathing, nonlabored ventilation and respiratory function stable Cardiovascular status: blood pressure returned to baseline and stable Postop Assessment: no apparent nausea or vomiting Anesthetic complications: no   No notable events documented.  Last Vitals:  Vitals:   09/16/23 2211 09/17/23 0338  BP: 136/63 126/69  Pulse: 63 (!) 59  Resp: 18 18  Temp: 36.8 C (!) 36.4 C  SpO2: 96% 95%    Last Pain:  Vitals:   09/17/23 0338  TempSrc: Oral  PainSc:                  Tommie Frame Filomeno Cromley

## 2023-09-17 NOTE — Transfer of Care (Signed)
 Immediate Anesthesia Transfer of Care Note  Patient: Taylor Richards  Procedure(s) Performed: LUMBAR FOUR-FIVE POSTERIOR LUMBAR INTERBODY FUSION (Spine Lumbar)  Patient Location: PACU  Anesthesia Type:General  Level of Consciousness: awake and alert   Airway & Oxygen Therapy: Patient Spontanous Breathing and Patient connected to nasal cannula oxygen  Post-op Assessment: Report given to RN and Post -op Vital signs reviewed and stable  Post vital signs: Reviewed and stable  Last Vitals:  Vitals Value Taken Time  BP 126/69 09/17/23 0338  Temp 36.4 C 09/17/23 0338  Pulse 59 09/17/23 0338  Resp 18 09/17/23 0338  SpO2 95 % 09/17/23 0338    Last Pain:  Vitals:   09/17/23 0706  TempSrc:   PainSc: 3       Patients Stated Pain Goal: 2 (09/17/23 2130)  Complications: No notable events documented.

## 2023-09-17 NOTE — Plan of Care (Signed)
 Pt doing well. Pt and family given D/C instructions with verbal understanding. Rx's were sent to the pharmacy by MD. Pt's incision is clean and dry with no sign of infection. Pt's IV was removed prior to D/C. Pt D/C'd home via wheelchair per MD order. Pt received RW from [employer] prior to D/C. Home Health was arranged by [facility] per MD order. Pt is stable @ D/C and has no other needs at this time. Barron Lien, RN

## 2023-09-17 NOTE — Evaluation (Signed)
 Occupational Therapy Evaluation and Discharge Patient Details Name: Taylor Richards MRN: [id number] DOB: Dec 05, 1945 Today's Date: 09/17/2023   History of Present Illness   Pt is a 78 yo female s/p posterior lumbar fusion 4-5.     Clinical Impressions This 78 yo female admitted and underwent above presents to acute OT with all education completed and post op back handout provided in [place] and [place]. No further OT needs, we will sign off.     If plan is discharge home, recommend the following:   Assistance with cooking/housework;Assist for transportation     Functional Status Assessment   Patient has had a recent decline in their functional status and/or demonstrates limited ability to make significant improvements in function in a reasonable and predictable amount of time     Equipment Recommendations   None recommended by OT      Precautions/Restrictions   Precautions Precautions: Back Precaution Booklet Issued: Yes (comment) (english and spanish) Recall of Precautions/Restrictions: Intact Required Braces or Orthoses: Spinal Brace Spinal Brace: Lumbar corset;Applied in sitting position Restrictions Weight Bearing Restrictions Per Provider Order: No     Mobility Bed Mobility Overal bed mobility: Modified Independent             General bed mobility comments: VCs for squencing the first time for OOB    Transfers Overall transfer level: Modified independent Equipment used: Rolling walker (2 wheels)                      Balance Overall balance assessment: Mild deficits observed, not formally tested                                         ADL either performed or assessed with clinical judgement   ADL                                         General ADL Comments: Educated and provided handout in [place] and Spanish for use of 2 cups for mouth care, use of wet wipes for back peri care, sequence of dressing,  use of pillows for positioning, building up sitting tolerance, and bed mobiltiy.     Vision Patient Visual Report: No change from baseline              Pertinent Vitals/Pain Pain Assessment Pain Assessment: 0-10 Pain Score: 5  Pain Location: incisional Pain Descriptors / Indicators: Aching, Sore Pain Intervention(s): Limited activity within patient's tolerance, Monitored during session, Repositioned     Extremity/Trunk Assessment Upper Extremity Assessment Upper Extremity Assessment: Overall WFL for tasks assessed           Communication Communication Communication: No apparent difficulties   Cognition Arousal: Alert Behavior During Therapy: WFL for tasks assessed/performed                                 Following commands: Intact       Cueing  Cueing Techniques: Verbal cues              Home Living Family/patient expects to be discharged to:: Private residence Living Arrangements: Children Available Help at Discharge: Family;Available 24 hours/day Type of Home: House Home Access: Ramped entrance     Home Layout:  One level     Bathroom Shower/Tub:  (1/2 bath downstairs (pt to sponge bath))   Bathroom Toilet: Standard     Home Equipment: None          Prior Functioning/Environment Prior Level of Function : Independent/Modified Independent                    OT Problem List: Decreased strength;Decreased range of motion;Impaired balance (sitting and/or standing);Pain        OT Goals(Current goals can be found in the care plan section)   Acute Rehab OT Goals Patient Stated Goal: to go home with son         AM-PAC OT "6 Clicks" Daily Activity     Outcome Measure Help from another person eating meals?: None Help from another person taking care of personal grooming?: None Help from another person toileting, which includes using toliet, bedpan, or urinal?: None Help from another person bathing (including washing,  rinsing, drying)?: None Help from another person to put on and taking off regular upper body clothing?: None Help from another person to put on and taking off regular lower body clothing?: None 6 Click Score: 24   End of Session Equipment Utilized During Treatment: Rolling walker (2 wheels);Back brace Nurse Communication: Mobility status (need for HHOT)  Activity Tolerance: Patient tolerated treatment well Patient left: in chair;with call bell/phone within reach  OT Visit Diagnosis: Unsteadiness on feet (R26.81);Pain Pain - part of body:  (incisional)                Time: 5409-8119 OT Time Calculation (min): 32 min Charges:  OT General Charges $OT Visit: 1 Visit OT Evaluation $OT Eval Moderate Complexity: 1 Mod OT Treatments $Self Care/Home Management : 8-22 mins Merryl Abraham OT Acute Rehabilitation Services Office [phone]    Lenox Raider 09/17/2023, 11:36 AM

## 2023-09-17 NOTE — Evaluation (Signed)
 Physical Therapy Evaluation  Patient Details Name: Taylor Richards MRN: [id number] DOB: [birth year] Today's Date: 09/17/2023  History of Present Illness  Pt is a 78 y/o female who presents s/p L4-L5 PLIF on 09/16/2023. PMH significant for HTN, hypothyroidism, osteopenia, pre-diabetes.  Clinical Impression  Pt admitted with above diagnosis. At the time of PT eval, pt was able to demonstrate transfers and ambulation with gross modified independence to CGA and RW for support. Pt was educated on precautions, brace application/wearing schedule, appropriate activity progression, and car transfer. Son present for education as well and supportive throughout session. Pt currently with functional limitations due to the deficits listed below (see PT Problem List). Pt will benefit from skilled PT to increase their independence and safety with mobility to allow discharge to the venue listed below.          If plan is discharge home, recommend the following: A little help with walking and/or transfers;A little help with bathing/dressing/bathroom;Assistance with cooking/housework;Assist for transportation;Help with stairs or ramp for entrance   Can travel by private vehicle        Equipment Recommendations Rolling walker (2 wheels)  Recommendations for Other Services       Functional Status Assessment Patient has had a recent decline in their functional status and demonstrates the ability to make significant improvements in function in a reasonable and predictable amount of time.     Precautions / Restrictions Precautions Precautions: Back;Fall Precaution Booklet Issued: Yes (comment) Recall of Precautions/Restrictions: Intact Precaution/Restrictions Comments: Reviewed handout and pt was cued for precautions during functional mobility. OT provided handout in [place] and [place]. Required Braces or Orthoses: Spinal Brace Spinal Brace: Lumbar corset;Applied in sitting position Restrictions Weight  Bearing Restrictions Per Provider Order: No      Mobility  Bed Mobility               General bed mobility comments: Pt was received sitting up in recliner. Verbally revierwed log roll technique    Transfers Overall transfer level: Modified independent Equipment used: Rolling walker (2 wheels)               General transfer comment: Pt demonstrated proper hand palcement on seated surface for safety.    Ambulation/Gait Ambulation/Gait assistance: Contact guard assist Gait Distance (Feet): 250 Feet Assistive device: Rolling walker (2 wheels) Gait Pattern/deviations: Step-through pattern, Decreased stride length, Trunk flexed Gait velocity: Decreased Gait velocity interpretation: 1.31 - 2.62 ft/sec, indicative of limited community ambulator   General Gait Details: VC's for improved posture, closer walker proximity and forward gaze. No assist required.  Stairs Stairs: Yes Stairs assistance: Contact guard assist Stair Management: Two rails, Step to pattern, Forwards Number of Stairs: 10 General stair comments: VC's for sequencing and general safety. Son present for education.  Wheelchair Mobility     Tilt Bed    Modified Rankin (Stroke Patients Only)       Balance Overall balance assessment: Mild deficits observed, not formally tested                                           Pertinent Vitals/Pain Pain Assessment Pain Assessment: Faces Faces Pain Scale: Hurts little more Pain Location: incisional Pain Descriptors / Indicators: Operative site guarding, Sore Pain Intervention(s): Limited activity within patient's tolerance, Monitored during session, Repositioned    Home Living Family/patient expects to be discharged to:: Private residence Living Arrangements:  Children Available Help at Discharge: Family;Available 24 hours/day Type of Home: House Home Access: Ramped entrance;Stairs to enter Entrance Stairs-Rails: Right Entrance  Stairs-Number of Steps: Ramp at sons, 3 stairs to enter at her home   Home Layout: One level Home Equipment: None Additional Comments: Lives alone but planning to stay with son at d/c for first 5 days or so    Prior Function Prior Level of Function : Independent/Modified Independent                     Extremity/Trunk Assessment   Upper Extremity Assessment Upper Extremity Assessment: Overall WFL for tasks assessed    Lower Extremity Assessment Lower Extremity Assessment: Generalized weakness (Mild; consistent with pre-op diagnosis)    Cervical / Trunk Assessment Cervical / Trunk Assessment: Back Surgery  Communication   Communication Communication: No apparent difficulties    Cognition Arousal: Alert Behavior During Therapy: WFL for tasks assessed/performed                             Following commands: Intact       Cueing Cueing Techniques: Verbal cues     General Comments      Exercises     Assessment/Plan    PT Assessment Patient needs continued PT services  PT Problem List Decreased strength;Decreased activity tolerance;Decreased balance;Decreased mobility;Decreased knowledge of use of DME;Decreased safety awareness;Decreased knowledge of precautions;Pain       PT Treatment Interventions DME instruction;Gait training;Stair training;Functional mobility training;Therapeutic activities;Therapeutic exercise;Balance training;Patient/family education    PT Goals (Current goals can be found in the Care Plan section)  Acute Rehab PT Goals Patient Stated Goal: Home today PT Goal Formulation: With patient/family Time For Goal Achievement: 09/24/23 Potential to Achieve Goals: Good    Frequency Min 5X/week     Co-evaluation               AM-PAC PT "6 Clicks" Mobility  Outcome Measure Help needed turning from your back to your side while in a flat bed without using bedrails?: None Help needed moving from lying on your back to  sitting on the side of a flat bed without using bedrails?: None Help needed moving to and from a bed to a chair (including a wheelchair)?: None Help needed standing up from a chair using your arms (e.g., wheelchair or bedside chair)?: None Help needed to walk in hospital room?: A Little Help needed climbing 3-5 steps with a railing? : A Little 6 Click Score: 22    End of Session Equipment Utilized During Treatment: Gait belt;Back brace Activity Tolerance: Patient tolerated treatment well Patient left: in chair;with call bell/phone within reach;with family/visitor present Nurse Communication: Mobility status PT Visit Diagnosis: Unsteadiness on feet (R26.81);Pain Pain - part of body:  (back)    Time: 1610-9604 PT Time Calculation (min) (ACUTE ONLY): 30 min   Charges:   PT Evaluation $PT Eval Low Complexity: 1 Low PT Treatments $Gait Training: 8-22 mins PT General Charges $$ ACUTE PT VISIT: 1 Visit         Simone Dubois, PT, DPT Acute Rehabilitation Services Secure Chat Preferred Office: [phone]   Venus Ginsberg 09/17/2023, 1:23 PM

## 2023-09-18 DIAGNOSIS — M4606 Spinal enthesopathy, lumbar region: Secondary | ICD-10-CM | POA: Diagnosis not present

## 2023-09-18 DIAGNOSIS — I10 Essential (primary) hypertension: Secondary | ICD-10-CM | POA: Diagnosis not present

## 2023-09-18 DIAGNOSIS — E785 Hyperlipidemia, unspecified: Secondary | ICD-10-CM | POA: Diagnosis not present

## 2023-09-18 DIAGNOSIS — M48062 Spinal stenosis, lumbar region with neurogenic claudication: Secondary | ICD-10-CM | POA: Diagnosis not present

## 2023-09-18 DIAGNOSIS — M4316 Spondylolisthesis, lumbar region: Secondary | ICD-10-CM | POA: Diagnosis not present

## 2023-09-18 DIAGNOSIS — Z4789 Encounter for other orthopedic aftercare: Secondary | ICD-10-CM | POA: Diagnosis not present

## 2023-09-18 DIAGNOSIS — E039 Hypothyroidism, unspecified: Secondary | ICD-10-CM | POA: Diagnosis not present

## 2023-09-18 DIAGNOSIS — Z556 Problems related to health literacy: Secondary | ICD-10-CM | POA: Diagnosis not present

## 2023-09-29 DIAGNOSIS — I10 Essential (primary) hypertension: Secondary | ICD-10-CM | POA: Diagnosis not present

## 2023-10-05 DIAGNOSIS — Z6830 Body mass index (BMI) 30.0-30.9, adult: Secondary | ICD-10-CM | POA: Diagnosis not present

## 2023-10-05 DIAGNOSIS — M48062 Spinal stenosis, lumbar region with neurogenic claudication: Secondary | ICD-10-CM | POA: Diagnosis not present

## 2023-10-05 DIAGNOSIS — M4316 Spondylolisthesis, lumbar region: Secondary | ICD-10-CM | POA: Diagnosis not present

## 2023-10-08 DIAGNOSIS — M5451 Vertebrogenic low back pain: Secondary | ICD-10-CM | POA: Diagnosis not present

## 2023-10-12 DIAGNOSIS — M5451 Vertebrogenic low back pain: Secondary | ICD-10-CM | POA: Diagnosis not present

## 2023-10-14 DIAGNOSIS — E782 Mixed hyperlipidemia: Secondary | ICD-10-CM | POA: Diagnosis not present

## 2023-10-14 DIAGNOSIS — I10 Essential (primary) hypertension: Secondary | ICD-10-CM | POA: Diagnosis not present

## 2023-10-15 DIAGNOSIS — M5451 Vertebrogenic low back pain: Secondary | ICD-10-CM | POA: Diagnosis not present

## 2023-10-20 DIAGNOSIS — M5451 Vertebrogenic low back pain: Secondary | ICD-10-CM | POA: Diagnosis not present

## 2023-10-23 DIAGNOSIS — M5451 Vertebrogenic low back pain: Secondary | ICD-10-CM | POA: Diagnosis not present

## 2023-10-26 DIAGNOSIS — E669 Obesity, unspecified: Secondary | ICD-10-CM | POA: Diagnosis not present

## 2023-10-26 DIAGNOSIS — I10 Essential (primary) hypertension: Secondary | ICD-10-CM | POA: Diagnosis not present

## 2023-10-26 DIAGNOSIS — E039 Hypothyroidism, unspecified: Secondary | ICD-10-CM | POA: Diagnosis not present

## 2023-10-26 DIAGNOSIS — M170 Bilateral primary osteoarthritis of knee: Secondary | ICD-10-CM | POA: Diagnosis not present

## 2023-10-26 DIAGNOSIS — M5451 Vertebrogenic low back pain: Secondary | ICD-10-CM | POA: Diagnosis not present

## 2023-10-26 DIAGNOSIS — M1711 Unilateral primary osteoarthritis, right knee: Secondary | ICD-10-CM | POA: Diagnosis not present

## 2023-10-29 DIAGNOSIS — M5451 Vertebrogenic low back pain: Secondary | ICD-10-CM | POA: Diagnosis not present

## 2023-11-03 DIAGNOSIS — M5451 Vertebrogenic low back pain: Secondary | ICD-10-CM | POA: Diagnosis not present

## 2023-11-04 DIAGNOSIS — I10 Essential (primary) hypertension: Secondary | ICD-10-CM | POA: Diagnosis not present

## 2023-11-05 DIAGNOSIS — M5451 Vertebrogenic low back pain: Secondary | ICD-10-CM | POA: Diagnosis not present

## 2023-11-09 DIAGNOSIS — M5451 Vertebrogenic low back pain: Secondary | ICD-10-CM | POA: Diagnosis not present

## 2023-11-12 DIAGNOSIS — M5451 Vertebrogenic low back pain: Secondary | ICD-10-CM | POA: Diagnosis not present

## 2023-11-16 DIAGNOSIS — M5451 Vertebrogenic low back pain: Secondary | ICD-10-CM | POA: Diagnosis not present

## 2023-11-19 DIAGNOSIS — M5451 Vertebrogenic low back pain: Secondary | ICD-10-CM | POA: Diagnosis not present

## 2023-11-24 DIAGNOSIS — M5451 Vertebrogenic low back pain: Secondary | ICD-10-CM | POA: Diagnosis not present

## 2023-11-26 DIAGNOSIS — M1711 Unilateral primary osteoarthritis, right knee: Secondary | ICD-10-CM | POA: Diagnosis not present

## 2023-11-26 DIAGNOSIS — E039 Hypothyroidism, unspecified: Secondary | ICD-10-CM | POA: Diagnosis not present

## 2023-11-26 DIAGNOSIS — M170 Bilateral primary osteoarthritis of knee: Secondary | ICD-10-CM | POA: Diagnosis not present

## 2023-11-26 DIAGNOSIS — E669 Obesity, unspecified: Secondary | ICD-10-CM | POA: Diagnosis not present

## 2023-11-26 DIAGNOSIS — I10 Essential (primary) hypertension: Secondary | ICD-10-CM | POA: Diagnosis not present

## 2023-12-03 DIAGNOSIS — M5451 Vertebrogenic low back pain: Secondary | ICD-10-CM | POA: Diagnosis not present

## 2023-12-04 DIAGNOSIS — I10 Essential (primary) hypertension: Secondary | ICD-10-CM | POA: Diagnosis not present

## 2023-12-10 DIAGNOSIS — M5451 Vertebrogenic low back pain: Secondary | ICD-10-CM | POA: Diagnosis not present

## 2023-12-17 DIAGNOSIS — M5451 Vertebrogenic low back pain: Secondary | ICD-10-CM | POA: Diagnosis not present

## 2023-12-22 DIAGNOSIS — E782 Mixed hyperlipidemia: Secondary | ICD-10-CM | POA: Diagnosis not present

## 2023-12-27 DIAGNOSIS — E669 Obesity, unspecified: Secondary | ICD-10-CM | POA: Diagnosis not present

## 2023-12-27 DIAGNOSIS — I10 Essential (primary) hypertension: Secondary | ICD-10-CM | POA: Diagnosis not present

## 2023-12-27 DIAGNOSIS — M170 Bilateral primary osteoarthritis of knee: Secondary | ICD-10-CM | POA: Diagnosis not present

## 2023-12-27 DIAGNOSIS — E039 Hypothyroidism, unspecified: Secondary | ICD-10-CM | POA: Diagnosis not present

## 2023-12-27 DIAGNOSIS — M1711 Unilateral primary osteoarthritis, right knee: Secondary | ICD-10-CM | POA: Diagnosis not present

## 2024-02-17 DIAGNOSIS — H25813 Combined forms of age-related cataract, bilateral: Secondary | ICD-10-CM | POA: Diagnosis not present

## 2024-02-17 DIAGNOSIS — H10413 Chronic giant papillary conjunctivitis, bilateral: Secondary | ICD-10-CM | POA: Diagnosis not present

## 2024-02-17 DIAGNOSIS — H35373 Puckering of macula, bilateral: Secondary | ICD-10-CM | POA: Diagnosis not present

## 2024-02-17 DIAGNOSIS — H40013 Open angle with borderline findings, low risk, bilateral: Secondary | ICD-10-CM | POA: Diagnosis not present

## 2024-02-22 DIAGNOSIS — E039 Hypothyroidism, unspecified: Secondary | ICD-10-CM | POA: Diagnosis not present

## 2024-02-22 DIAGNOSIS — I10 Essential (primary) hypertension: Secondary | ICD-10-CM | POA: Diagnosis not present

## 2024-02-22 DIAGNOSIS — E782 Mixed hyperlipidemia: Secondary | ICD-10-CM | POA: Diagnosis not present

## 2024-02-22 DIAGNOSIS — E559 Vitamin D deficiency, unspecified: Secondary | ICD-10-CM | POA: Diagnosis not present

## 2024-02-22 DIAGNOSIS — R7303 Prediabetes: Secondary | ICD-10-CM | POA: Diagnosis not present

## 2024-02-26 DIAGNOSIS — M8588 Other specified disorders of bone density and structure, other site: Secondary | ICD-10-CM | POA: Diagnosis not present

## 2024-02-26 DIAGNOSIS — E559 Vitamin D deficiency, unspecified: Secondary | ICD-10-CM | POA: Diagnosis not present

## 2024-02-26 DIAGNOSIS — M25561 Pain in right knee: Secondary | ICD-10-CM | POA: Diagnosis not present

## 2024-02-26 DIAGNOSIS — E039 Hypothyroidism, unspecified: Secondary | ICD-10-CM | POA: Diagnosis not present

## 2024-02-26 DIAGNOSIS — E782 Mixed hyperlipidemia: Secondary | ICD-10-CM | POA: Diagnosis not present

## 2024-02-26 DIAGNOSIS — I10 Essential (primary) hypertension: Secondary | ICD-10-CM | POA: Diagnosis not present

## 2024-02-26 DIAGNOSIS — Z0000 Encounter for general adult medical examination without abnormal findings: Secondary | ICD-10-CM | POA: Diagnosis not present

## 2024-02-26 DIAGNOSIS — K760 Fatty (change of) liver, not elsewhere classified: Secondary | ICD-10-CM | POA: Diagnosis not present

## 2024-02-26 DIAGNOSIS — R7303 Prediabetes: Secondary | ICD-10-CM | POA: Diagnosis not present

## 2024-02-26 DIAGNOSIS — Z23 Encounter for immunization: Secondary | ICD-10-CM | POA: Diagnosis not present

## 2024-02-26 DIAGNOSIS — K219 Gastro-esophageal reflux disease without esophagitis: Secondary | ICD-10-CM | POA: Diagnosis not present

## 2024-03-02 DIAGNOSIS — M25561 Pain in right knee: Secondary | ICD-10-CM | POA: Diagnosis not present

## 2024-03-02 DIAGNOSIS — M1711 Unilateral primary osteoarthritis, right knee: Secondary | ICD-10-CM | POA: Diagnosis not present

## 2024-03-02 DIAGNOSIS — M25461 Effusion, right knee: Secondary | ICD-10-CM | POA: Diagnosis not present

## 2024-05-18 ENCOUNTER — Other Ambulatory Visit: Payer: Self-pay | Admitting: Family Medicine

## 2024-05-18 DIAGNOSIS — Z1231 Encounter for screening mammogram for malignant neoplasm of breast: Secondary | ICD-10-CM

## 2024-05-19 ENCOUNTER — Ambulatory Visit
Admission: RE | Admit: 2024-05-19 | Discharge: 2024-05-19 | Disposition: A | Discharge status: Home / Self Care | Source: Ambulatory Visit | Attending: Family Medicine | Admitting: Family Medicine

## 2024-05-19 DIAGNOSIS — Z1231 Encounter for screening mammogram for malignant neoplasm of breast: Secondary | ICD-10-CM
